# Patient Record
Sex: Female | Born: 1945 | Race: Black or African American | Hispanic: No | Marital: Single | Smoking: Former smoker
Health system: Southern US, Community
[De-identification: ages and names within clinical notes are randomized; demographics above are authoritative.]

## PROBLEM LIST (undated history)

## (undated) DIAGNOSIS — I10 Essential (primary) hypertension: Secondary | ICD-10-CM

## (undated) DIAGNOSIS — I471 Supraventricular tachycardia, unspecified: Secondary | ICD-10-CM

## (undated) DIAGNOSIS — F4310 Post-traumatic stress disorder, unspecified: Secondary | ICD-10-CM

## (undated) DIAGNOSIS — I499 Cardiac arrhythmia, unspecified: Secondary | ICD-10-CM

## (undated) DIAGNOSIS — K5720 Diverticulitis of large intestine with perforation and abscess without bleeding: Secondary | ICD-10-CM

## (undated) DIAGNOSIS — Z4889 Encounter for other specified surgical aftercare: Secondary | ICD-10-CM

## (undated) HISTORY — PX: KNEE SURGERY: SHX244

## (undated) HISTORY — PX: CHOLECYSTECTOMY: SHX55

---

## 2017-03-28 ENCOUNTER — Other Ambulatory Visit: Payer: Self-pay

## 2017-03-28 ENCOUNTER — Encounter (HOSPITAL_COMMUNITY): Payer: Self-pay | Admitting: *Deleted

## 2017-03-28 ENCOUNTER — Inpatient Hospital Stay (HOSPITAL_COMMUNITY)
Admission: AD | Admit: 2017-03-28 | Discharge: 2017-03-30 | DRG: 309 | Disposition: A | Discharge status: Home / Self Care | Payer: Medicare Other | Source: Other Acute Inpatient Hospital | Attending: Cardiology | Admitting: Cardiology

## 2017-03-28 DIAGNOSIS — R778 Other specified abnormalities of plasma proteins: Secondary | ICD-10-CM

## 2017-03-28 DIAGNOSIS — I471 Supraventricular tachycardia, unspecified: Secondary | ICD-10-CM | POA: Diagnosis present

## 2017-03-28 DIAGNOSIS — Z8249 Family history of ischemic heart disease and other diseases of the circulatory system: Secondary | ICD-10-CM | POA: Diagnosis not present

## 2017-03-28 DIAGNOSIS — K5901 Slow transit constipation: Secondary | ICD-10-CM

## 2017-03-28 DIAGNOSIS — F4310 Post-traumatic stress disorder, unspecified: Secondary | ICD-10-CM | POA: Diagnosis present

## 2017-03-28 DIAGNOSIS — I10 Essential (primary) hypertension: Secondary | ICD-10-CM

## 2017-03-28 DIAGNOSIS — I2510 Atherosclerotic heart disease of native coronary artery without angina pectoris: Secondary | ICD-10-CM | POA: Diagnosis present

## 2017-03-28 DIAGNOSIS — R079 Chest pain, unspecified: Secondary | ICD-10-CM

## 2017-03-28 DIAGNOSIS — I248 Other forms of acute ischemic heart disease: Secondary | ICD-10-CM | POA: Diagnosis present

## 2017-03-28 DIAGNOSIS — R7989 Other specified abnormal findings of blood chemistry: Secondary | ICD-10-CM

## 2017-03-28 DIAGNOSIS — Z87891 Personal history of nicotine dependence: Secondary | ICD-10-CM | POA: Diagnosis not present

## 2017-03-28 HISTORY — DX: Post-traumatic stress disorder, unspecified: F43.10

## 2017-03-28 HISTORY — DX: Essential (primary) hypertension: I10

## 2017-03-28 HISTORY — DX: Supraventricular tachycardia: I47.1

## 2017-03-28 HISTORY — DX: Cardiac arrhythmia, unspecified: I49.9

## 2017-03-28 HISTORY — DX: Supraventricular tachycardia, unspecified: I47.10

## 2017-03-28 LAB — LIPID PANEL
Cholesterol: 241 mg/dL — ABNORMAL HIGH (ref 0–200)
HDL: 59 mg/dL (ref >40)
LDL CALC: 170 mg/dL — AB (ref 0–99)
Total CHOL/HDL Ratio: 4.1 RATIO
Triglycerides: 58 mg/dL (ref <150)
VLDL: 12 mg/dL (ref 0–40)

## 2017-03-28 LAB — TROPONIN I
TROPONIN I: 0.32 ng/mL — AB (ref <0.03)
Troponin I: 0.22 ng/mL (ref <0.03)
Troponin I: 0.18 ng/mL (ref <0.03)

## 2017-03-28 LAB — HEPARIN LEVEL (UNFRACTIONATED): HEPARIN UNFRACTIONATED: 0.55 [IU]/mL (ref 0.30–0.70)

## 2017-03-28 LAB — BRAIN NATRIURETIC PEPTIDE: B NATRIURETIC PEPTIDE 5: 61.6 pg/mL (ref 0.0–100.0)

## 2017-03-28 LAB — TSH: TSH: 2.521 u[IU]/mL (ref 0.350–4.500)

## 2017-03-28 MED ORDER — HEPARIN BOLUS VIA INFUSION
4000.0000 [IU] | Freq: Once | INTRAVENOUS | Status: AC
  Administered 2017-03-28: 4000 [IU] via INTRAVENOUS
  Filled 2017-03-28: qty 4000

## 2017-03-28 MED ORDER — SODIUM CHLORIDE 0.9% FLUSH
3.0000 mL | Freq: Two times a day (BID) | INTRAVENOUS | Status: DC
  Administered 2017-03-29 – 2017-03-30 (×3): 3 mL via INTRAVENOUS

## 2017-03-28 MED ORDER — ASPIRIN EC 81 MG PO TBEC
81.0000 mg | DELAYED_RELEASE_TABLET | Freq: Every day | ORAL | Status: DC
  Administered 2017-03-28 – 2017-03-30 (×3): 81 mg via ORAL
  Filled 2017-03-28 (×3): qty 1

## 2017-03-28 MED ORDER — ATENOLOL 25 MG PO TABS
25.0000 mg | ORAL_TABLET | Freq: Every day | ORAL | Status: DC
  Administered 2017-03-28 – 2017-03-29 (×2): 25 mg via ORAL
  Filled 2017-03-28 (×2): qty 1

## 2017-03-28 MED ORDER — HYDROCHLOROTHIAZIDE 25 MG PO TABS
25.0000 mg | ORAL_TABLET | Freq: Every day | ORAL | Status: DC
  Administered 2017-03-28 – 2017-03-30 (×3): 25 mg via ORAL
  Filled 2017-03-28 (×3): qty 1

## 2017-03-28 MED ORDER — AMLODIPINE BESYLATE 10 MG PO TABS
10.0000 mg | ORAL_TABLET | Freq: Every day | ORAL | Status: DC
  Administered 2017-03-28 – 2017-03-30 (×3): 10 mg via ORAL
  Filled 2017-03-28 (×3): qty 1

## 2017-03-28 MED ORDER — ATORVASTATIN CALCIUM 80 MG PO TABS
80.0000 mg | ORAL_TABLET | Freq: Every day | ORAL | Status: DC
  Administered 2017-03-28 – 2017-03-29 (×2): 80 mg via ORAL
  Filled 2017-03-28 (×2): qty 1

## 2017-03-28 MED ORDER — ACETAMINOPHEN 325 MG PO TABS
650.0000 mg | ORAL_TABLET | Freq: Four times a day (QID) | ORAL | Status: DC | PRN
  Administered 2017-03-28 – 2017-03-30 (×6): 650 mg via ORAL
  Filled 2017-03-28 (×6): qty 2

## 2017-03-28 MED ORDER — HEPARIN (PORCINE) IN NACL 100-0.45 UNIT/ML-% IJ SOLN
900.0000 [IU]/h | INTRAMUSCULAR | Status: DC
  Administered 2017-03-28 (×2): 900 [IU]/h via INTRAVENOUS
  Filled 2017-03-28 (×2): qty 250

## 2017-03-28 NOTE — Progress Notes (Signed)
Admitted with SVT (now resolved) and elevated troponin; continue atenolol; cycle enzymes. Brandi Hendrix

## 2017-03-28 NOTE — Progress Notes (Signed)
ANTICOAGULATION CONSULT NOTE - Follow Up Consult  Pharmacy Consult for Heparin Indication: chest pain/ACS  No Known Allergies  Patient Measurements: Height:  (167.6 cm) Weight: 153 lb 9.6 oz (69.7 kg) IBW/kg (Calculated) : 59.3 Heparin Dosing Weight:    Vital Signs: Temp: 97.5 F (36.4 C) (04/07 0502) Temp Source: Oral (04/07 0502) BP: 130/78 (04/07 0502) Pulse Rate: 79 (04/07 0502)  Labs:  Recent Labs  03/28/17 0631 03/28/17 1258  HEPARINUNFRC  --  0.55  TROPONINI 0.32*  --     CrCl cannot be calculated (No order found.).   Assessment: CC/HPI: Presented to [facility] with CP/palpitations and SVT. Found to have trop 0.08. No heparin given at [facility]. To begin heparin gtt for r/o ACS. --Labs from [place]: SCr 1.15, Hgb 12, Hct 38.2, plt 318  PMH: HTN, PTSD, h/o rheumatic fever, palpitations, constipation  Anticoag: Heparin for r/o ACS. HL 0.55 in goal  Goal of Therapy:  Heparin level 0.3-0.7 units/ml Monitor platelets by anticoagulation protocol: Yes   Plan:  Continue Heparin gtt at 900 units/hr Daily heparin level and CBC   Tija Biss S. Merilynn Finland, PharmD, BCPS Clinical Staff Pharmacist Pager [phone]  Misty Stanley Stillinger 03/28/2017,1:58 PM

## 2017-03-28 NOTE — H&P (Signed)
CARDIOLOGY OBSERVATION HISTORY AND PHYSICAL EXAMINATION NOTE  Patient ID: Brandi Hendrix MRN: [id number], DOB/AGE: [birth year]   Admit date: 03/28/2017   Primary Physician: No PCP Per Patient Primary Cardiologist: new  Reason for admission: Chest pain and SVT  HPI: This is a 71 y.o. AA female with no prior history of CAD but history of HTN and PTSD presented to [facility] with chest discomfort and palpitations.  Patient said that today she started having sudden onset chest tightness which was substernal in location, without radiation, started at rest associated with palpitations. Patient had palpitations last week as well however these lasted longer than usual palpitations. She recalls she had rheumatic fever when she was 12. Patient denied chest pain, dyspnea, PND, orthopnea, leg swelling, increased abdominal girth, syncope, presyncope. She also denied  Easy bruisability, prior bleeding. She frequently gets constipated and eats meat every day.  Her initial troponin was 0.08 so she was transferred to [facility].   Problem List: Past Medical History:  Diagnosis Date  . Dysrhythmia   . Hypertension   . PTSD (post-traumatic stress disorder)     Past Surgical History:  Procedure Laterality Date  . CHOLECYSTECTOMY    . KNEE SURGERY       Allergies: Not on File   Home Medications Current Facility-Administered Medications  Medication Dose Route Frequency Provider Last Rate Last Dose  . acetaminophen (TYLENOL) tablet 650 mg  650 mg Oral Q6H PRN Joellyn Rued, MD   650 mg at 03/28/17 0636  . amLODipine (NORVASC) tablet 10 mg  10 mg Oral Daily Joellyn Rued, MD      . aspirin EC tablet 81 mg  81 mg Oral Daily Joellyn Rued, MD      . atenolol (TENORMIN) tablet 25 mg  25 mg Oral Daily Joellyn Rued, MD      . atorvastatin (LIPITOR) tablet 80 mg  80 mg Oral q1800 Joellyn Rued, MD      . heparin ADULT infusion 100 units/mL (25000 units/283mL sodium chloride 0.45%)   900 Units/hr Intravenous Continuous Titus Mould, RPH 9 mL/hr at 03/28/17 1308 900 Units/hr at 03/28/17 6578  . hydrochlorothiazide (HYDRODIURIL) tablet 25 mg  25 mg Oral Daily Joellyn Rued, MD      . sodium chloride flush (NS) 0.9 % injection 3 mL  3 mL Intravenous Q12H Joellyn Rued, MD         Family History  Problem Relation Age of Onset  . Heart disease Mother   . Parkinson's disease Father      Social History   Social History  . Marital status: Single    Spouse name: N/A  . Number of children: N/A  . Years of education: N/A   Occupational History  . Not on file.   Social History Main Topics  . Smoking status: Former Smoker    Types: Cigarettes    Quit date: 73  . Smokeless tobacco: Not on file  . Alcohol use No     Comment: former user  . Drug use: Unknown     Comment: former  . Sexual activity: Not on file   Other Topics Concern  . Not on file   Social History Narrative  . No narrative on file     Review of Systems: General: negative for chills, fever, night sweats or weight changes.  Cardiovascular: chest pain negative for dyspnea on exertion, edema, orthopnea, palpitations, paroxysmal nocturnal dyspnea or shortness of breath  Dermatological:  negative for rash Respiratory: negative for cough or wheezing Urologic: negative for hematuria Abdominal: constipation negative for nausea, vomiting, diarrhea, bright red blood per rectum, melena, or hematemesis Neurologic: negative for visual changes, syncope, or dizziness Endocrine: no diabetes, no hypothyroidism Immunological: no lymph adenopathy Psych: non homicidal/suicidal  Physical Exam: Vitals: BP 130/78 (BP Location: Right Arm)   Pulse 79   Temp 97.5 F (36.4 C) (Oral)   Ht  (1.676 m)   Wt 69.7 kg (153 lb 9.6 oz)   SpO2 100%   BMI 24.79 kg/m  General: not in acute distress Neck: JVP flat, neck supple Heart: regular rate and rhythm, S1, S2, no murmurs  Lungs: CTAB  GI: non tender,  non distended, bowel sounds present Extremities: no edema Neuro: AAO x 3  Psych: normal affect, no anxiety   Labs:  No results found for this or any previous visit (from the past 24 hour(s)).   Radiology/Studies: No results found.  EKG: SVT on outside strips,  03/28/17 Normal sinus rhythm, no ST T wave changes, no blocks  Echo: pending  Cardiac cath: not performed  Medical decision making:  Discussed care with the patient and husband Discussed care with the outside ED physician on the phone Reviewed labs and imaging personally Reviewed prior records  ASSESSMENT AND PLAN:  This is a 71 y.o. AA female risk factors and no prior CAD presented with chest pain and SVT    Active Problems:   SVT (supraventricular tachycardia) (HCC)   AVNRT, symptomatic - cycle troponin, keep potassium >4, calcium >9, magnesium >2, echocardiogram in the AM - started atenolol, EP consult  Chest pain in the setting of SVT with elevated troponin -cycle troponin, aspirin and Lipitor - will treat with IV heparin for now but after next troponin if flat can hold. Most likely EP will require ischemic evaluation prior to proceeding treatment of SVT  Hypertension, essential Continue HCTZ and amlodipine  PTSD on abilify at home, not taking for 1 week  Constipation 2/2 low fiber diet - increase fiber, golytley  Signed, Joellyn Rued, MD MS 03/28/2017, 7:29 AM

## 2017-03-28 NOTE — Plan of Care (Signed)
Problem: Safety: Goal: Ability to remain free from injury will improve Outcome: Progressing Encouraged to use call bell. Spouse by the bedside.

## 2017-03-28 NOTE — Progress Notes (Signed)
  Patient admitted from [facility] for SVT. Vitals stable. Oriented to roo and surroundings. Telemetry applied. CCM  notified. Skin assessment done times two nurse.

## 2017-03-28 NOTE — Progress Notes (Signed)
ANTICOAGULATION CONSULT NOTE - Initial Consult  Pharmacy Consult for Heparin Indication: chest pain/ACS  Not on File  Patient Measurements: Height:  (167.6 cm) Weight: 153 lb 9.6 oz (69.7 kg) IBW/kg (Calculated) : 59.3 Heparin Dosing Weight: 69.7 kg  Vital Signs: Temp: 97.5 F (36.4 C) (04/07 0502) Temp Source: Oral (04/07 0502) BP: 130/78 (04/07 0502) Pulse Rate: 79 (04/07 0502)  Labs: No results for input(s): HGB, HCT, PLT, APTT, LABPROT, INR, HEPARINUNFRC, HEPRLOWMOCWT, CREATININE, CKTOTAL, CKMB, TROPONINI in the last 72 hours.  CrCl cannot be calculated (No order found.).   Medical History: No past medical history on file.  Medications:  Awaiting electronic med rec  Assessment: 71 y.o. F presented to [facility] with CP. Found to have trop 0.08. No heparin given at [facility]. To begin heparin gtt for r/o ACS.  Labs from [place]: SCr 1.15, Hgb 12, Hct 38.2, plt 318  Goal of Therapy:  Heparin level 0.3-0.7 units/ml Monitor platelets by anticoagulation protocol: Yes   Plan:  Heparin IV bolus 4000 units Heparin gtt at 900 units/hr Will f/u heparin level in 6 hours Daily heparin level and CBC   Sahana Boyland, Hilario Quarry 03/28/2017,5:59 AM

## 2017-03-29 ENCOUNTER — Observation Stay (HOSPITAL_BASED_OUTPATIENT_CLINIC_OR_DEPARTMENT_OTHER): Payer: Medicare Other

## 2017-03-29 DIAGNOSIS — K5901 Slow transit constipation: Secondary | ICD-10-CM | POA: Diagnosis present

## 2017-03-29 DIAGNOSIS — I248 Other forms of acute ischemic heart disease: Secondary | ICD-10-CM | POA: Diagnosis present

## 2017-03-29 DIAGNOSIS — I10 Essential (primary) hypertension: Secondary | ICD-10-CM | POA: Diagnosis not present

## 2017-03-29 DIAGNOSIS — R079 Chest pain, unspecified: Secondary | ICD-10-CM | POA: Diagnosis present

## 2017-03-29 DIAGNOSIS — I2510 Atherosclerotic heart disease of native coronary artery without angina pectoris: Secondary | ICD-10-CM | POA: Diagnosis present

## 2017-03-29 DIAGNOSIS — R072 Precordial pain: Secondary | ICD-10-CM

## 2017-03-29 DIAGNOSIS — I471 Supraventricular tachycardia: Secondary | ICD-10-CM | POA: Diagnosis present

## 2017-03-29 DIAGNOSIS — Z87891 Personal history of nicotine dependence: Secondary | ICD-10-CM | POA: Diagnosis not present

## 2017-03-29 DIAGNOSIS — Z8249 Family history of ischemic heart disease and other diseases of the circulatory system: Secondary | ICD-10-CM | POA: Diagnosis not present

## 2017-03-29 DIAGNOSIS — F4310 Post-traumatic stress disorder, unspecified: Secondary | ICD-10-CM | POA: Diagnosis present

## 2017-03-29 DIAGNOSIS — R748 Abnormal levels of other serum enzymes: Secondary | ICD-10-CM

## 2017-03-29 LAB — COMPREHENSIVE METABOLIC PANEL
ALBUMIN: 3.3 g/dL — AB (ref 3.5–5.0)
ALT: 18 U/L (ref 14–54)
AST: 23 U/L (ref 15–41)
Alkaline Phosphatase: 42 U/L (ref 38–126)
Anion gap: 9 (ref 5–15)
BUN: 15 mg/dL (ref 6–20)
CHLORIDE: 104 mmol/L (ref 101–111)
CO2: 25 mmol/L (ref 22–32)
Calcium: 9.5 mg/dL (ref 8.9–10.3)
Creatinine, Ser: 0.94 mg/dL (ref 0.44–1.00)
GFR calc Af Amer: >60 mL/min (ref >60)
Glucose, Bld: 89 mg/dL (ref 65–99)
POTASSIUM: 3.5 mmol/L (ref 3.5–5.1)
SODIUM: 138 mmol/L (ref 135–145)
Total Bilirubin: 0.4 mg/dL (ref 0.3–1.2)
Total Protein: 7.6 g/dL (ref 6.5–8.1)

## 2017-03-29 LAB — CBC
HCT: 37.4 % (ref 36.0–46.0)
HEMOGLOBIN: 12 g/dL (ref 12.0–15.0)
MCH: 28.6 pg (ref 26.0–34.0)
MCHC: 32.1 g/dL (ref 30.0–36.0)
MCV: 89.3 fL (ref 78.0–100.0)
Platelets: 339 10*3/uL (ref 150–400)
RBC: 4.19 MIL/uL (ref 3.87–5.11)
RDW: 14.4 % (ref 11.5–15.5)
WBC: 7.2 10*3/uL (ref 4.0–10.5)

## 2017-03-29 LAB — ECHOCARDIOGRAM COMPLETE
HEIGHTINCHES: 66 in
WEIGHTICAEL: 2502.4 [oz_av]

## 2017-03-29 LAB — HEMOGLOBIN A1C
HEMOGLOBIN A1C: 5.9 % — AB (ref 4.8–5.6)
MEAN PLASMA GLUCOSE: 123 mg/dL

## 2017-03-29 LAB — PROTIME-INR
INR: 1.03
PROTHROMBIN TIME: 13.5 s (ref 11.4–15.2)

## 2017-03-29 LAB — HEPARIN LEVEL (UNFRACTIONATED): Heparin Unfractionated: 0.57 IU/mL (ref 0.30–0.70)

## 2017-03-29 LAB — GLUCOSE, CAPILLARY: GLUCOSE-CAPILLARY: 70 mg/dL (ref 65–99)

## 2017-03-29 MED ORDER — HEPARIN SODIUM (PORCINE) 5000 UNIT/ML IJ SOLN
5000.0000 [IU] | Freq: Three times a day (TID) | INTRAMUSCULAR | Status: DC
  Administered 2017-03-29 – 2017-03-30 (×4): 5000 [IU] via SUBCUTANEOUS
  Filled 2017-03-29 (×4): qty 1

## 2017-03-29 MED ORDER — ATENOLOL 25 MG PO TABS
50.0000 mg | ORAL_TABLET | Freq: Every day | ORAL | Status: DC
  Administered 2017-03-30: 50 mg via ORAL
  Filled 2017-03-29: qty 2

## 2017-03-29 NOTE — Progress Notes (Signed)
  Echocardiogram 2D Echocardiogram has been performed.  Tye Savoy 03/29/2017, 3:23 PM

## 2017-03-29 NOTE — Progress Notes (Signed)
Progress Note  Patient Name: Brandi Hendrix Date of Encounter: 03/29/2017  Primary Cardiologist: New  Subjective   Mild residual chest soreness; no dyspnea  Inpatient Medications    Scheduled Meds: . amLODipine  10 mg Oral Daily  . aspirin EC  81 mg Oral Daily  . atenolol  25 mg Oral Daily  . atorvastatin  80 mg Oral q1800  . hydrochlorothiazide  25 mg Oral Daily  . sodium chloride flush  3 mL Intravenous Q12H   Continuous Infusions: . heparin 900 Units/hr (03/28/17 2300)   PRN Meds: acetaminophen   Vital Signs    Vitals:   03/28/17 2100 03/29/17 0421 03/29/17 0455 03/29/17 0900  BP: 104/68  123/83 124/71  Pulse: 64  73 89  Resp: Temp: 97.9 F (36.6 C)  97.4 F (36.3 C) 98 F (36.7 C)  TempSrc: Oral  Oral Oral  SpO2: 98%  100% 100%  Weight:  156 lb 6.4 oz (70.9 kg)    Height:        Intake/Output Summary (Last 24 hours) at 03/29/17 1005 Last data filed at 03/29/17 0800  Gross per 24 hour  Intake              312 ml  Output                0 ml  Net              312 ml   Filed Weights   03/28/17 0502 03/28/17 0900 03/29/17 0421  Weight: 153 lb 9.6 oz (69.7 kg) 153 lb 9.6 oz (69.7 kg) 156 lb 6.4 oz (70.9 kg)    Telemetry    Sinus with PVC- Personally Reviewed  Physical Exam   GEN: No acute distress.   Neck: No JVD Cardiac: RRR, no murmurs, rubs, or gallops.  Respiratory: Clear to auscultation bilaterally. GI: Soft, nontender, non-distended  MS: No edema; No deformity. Neuro:  Nonfocal  Psych: Normal affect   Labs    Chemistry Recent Labs Lab 03/29/17 0236  NA 138  K 3.5  CL 104  CO2 25  GLUCOSE 89  BUN 15  CREATININE 0.94  CALCIUM 9.5  PROT 7.6  ALBUMIN 3.3*  AST 23  ALT 18  ALKPHOS 42  BILITOT 0.4  GFRNONAA >60  GFRAA >60  ANIONGAP 9     Hematology Recent Labs Lab 03/29/17 0236  WBC 7.2  RBC 4.19  HGB 12.0  HCT 37.4  MCV 89.3  MCH 28.6  MCHC 32.1  RDW 14.4  PLT 339    Cardiac Enzymes Recent  Labs Lab 03/28/17 0631 03/28/17 1258 03/28/17 1805  TROPONINI 0.32* 0.22* 0.18*     BNP Recent Labs Lab 03/28/17 0631  BNP 61.6     Patient Profile     71 y.o. female transferred from [facility] following episode of supraventricular tachycardia terminated with adenosine; associated chest tightness.  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 Atenolol initiated. Increase to 50 mg daily. If she has recurrent episodes despite beta-blockade she would need to be evaluated by electrophysiology for consideration of ablation.  2 elevated troponin-likely demand ischemia in the setting of SVT. She did not have exertional chest pain prior to admission. She has minimal residual soreness this morning. We will arrange a stress nuclear study tomorrow morning to screen for coronary disease. If negative would favor medical therapy. Schedule echocardiogram to assess LV function. Continue aspirin and statin. Discontinue  IV heparin.  3 hypertension-blood pressure controlled. Continue present medications.  Signed, Olga Millers, MD  03/29/2017, 10:05 AM

## 2017-03-30 ENCOUNTER — Inpatient Hospital Stay (HOSPITAL_COMMUNITY): Payer: Medicare Other

## 2017-03-30 ENCOUNTER — Encounter (HOSPITAL_COMMUNITY): Payer: Self-pay | Admitting: Student

## 2017-03-30 DIAGNOSIS — R748 Abnormal levels of other serum enzymes: Secondary | ICD-10-CM

## 2017-03-30 LAB — NM MYOCAR MULTI W/SPECT W/WALL MOTION / EF
CSEPED: 0 min
CSEPEDS: 0 s
CSEPEW: 1 METS
CSEPHR: 63 %
CSEPPHR: 96 {beats}/min
MPHR: 151 {beats}/min
Rest HR: 76 {beats}/min

## 2017-03-30 LAB — GLUCOSE, CAPILLARY
Glucose-Capillary: 99 mg/dL (ref 65–99)
Glucose-Capillary: 89 mg/dL (ref 65–99)

## 2017-03-30 MED ORDER — ATENOLOL 50 MG PO TABS: 50.0000 mg | ORAL_TABLET | Freq: Every day | ORAL | 11 refills | Status: AC

## 2017-03-30 MED ORDER — PROPRANOLOL HCL 10 MG PO TABS
10.0000 mg | ORAL_TABLET | Freq: Four times a day (QID) | ORAL | Status: DC | PRN
  Filled 2017-03-30: qty 1

## 2017-03-30 MED ORDER — ASPIRIN 81 MG PO TBEC: 81.0000 mg | DELAYED_RELEASE_TABLET | Freq: Every day | ORAL | Status: AC

## 2017-03-30 MED ORDER — REGADENOSON 0.4 MG/5ML IV SOLN
0.4000 mg | Freq: Once | INTRAVENOUS | Status: AC
  Administered 2017-03-30: 0.4 mg via INTRAVENOUS
  Filled 2017-03-30: qty 5

## 2017-03-30 MED ORDER — ATORVASTATIN CALCIUM 80 MG PO TABS: 80.0000 mg | ORAL_TABLET | Freq: Every day | ORAL | 11 refills | Status: AC

## 2017-03-30 MED ORDER — TECHNETIUM TC 99M TETROFOSMIN IV KIT
30.0000 | PACK | Freq: Once | INTRAVENOUS | Status: AC | PRN
  Administered 2017-03-30: 30 via INTRAVENOUS

## 2017-03-30 MED ORDER — TECHNETIUM TC 99M TETROFOSMIN IV KIT
10.0000 | PACK | Freq: Once | INTRAVENOUS | Status: AC | PRN
  Administered 2017-03-30: 10 via INTRAVENOUS

## 2017-03-30 MED ORDER — PROPRANOLOL HCL 10 MG PO TABS: 10.0000 mg | ORAL_TABLET | Freq: Four times a day (QID) | ORAL | 3 refills | Status: AC | PRN

## 2017-03-30 MED ORDER — REGADENOSON 0.4 MG/5ML IV SOLN
INTRAVENOUS | Status: AC
  Administered 2017-03-30: 0.4 mg via INTRAVENOUS
  Filled 2017-03-30: qty 5

## 2017-03-30 NOTE — Discharge Summary (Signed)
Discharge Summary    Patient ID: Brandi Hendrix,  MRN: [id number], DOB/AGE: 1946/01/11 71 y.o.  Admit date: 03/28/2017 Discharge date: 03/30/2017  Primary Care Provider: No PCP Per Patient Primary Cardiologist: Seen by Dr. Jens Som --> Followed by the [facility]  Discharge Diagnoses    Active Problems:   SVT (supraventricular tachycardia) (HCC)   Chest pain with high risk of acute coronary syndrome   Slow transit constipation   Essential hypertension   History of Present Illness     Brandi Hendrix is a 71 y.o. female with past medical history of HTN and PTSD who presented to [facility] on 03/28/2017 as a transfer from [facility] for chest pain and pSVT.   Reported having chest discomfort and palpitations starting earlier in the day which occurred while at rest. She was noted to have an episode of SVT while at [facility] with initial troponin being 0.08. She was started on Atenolol and admitted for further observation.   Hospital Course     Consultants: None  The following day, she reported a mild chest soreness which was significantly improved when compared to her presenting symptoms. Her episode of SVT was thought to be due to AVNRT. Atenolol was increased from  daily to  daily. Cyclic troponin values were at 0.32, 0.22, and 0.18. Echocardiogram showed a preserved EF of 60-65% with Grade 1 DD and no regional WMA. A nuclear stress test was ordered for 03/30/2017 to evaluate for ischemia.   Her stress test was performed and showed normal LV function with no evidence of reversible ischemia or prior infarction. She was last examined by Dr. Elease Hashimoto and deemed stable for discharge. She will continue on Atenolol  daily along with being provided an Rx for Propranolol  QID PRN for palpitations. [facility] but if unable to do so, our contact information was provided in her AVS. She was discharged home in good condition.    _____________  Discharge  Vitals Blood pressure 108/62, pulse 63, temperature 98.4 F (36.9 C), temperature source Oral, resp. rate 20, height  (1.676 m), weight 153 lb 6.4 oz (69.6 kg), SpO2 95 %.  Filed Weights   03/28/17 0900 03/29/17 0421 03/30/17 0431  Weight: 153 lb 9.6 oz (69.7 kg) 156 lb 6.4 oz (70.9 kg) 153 lb 6.4 oz (69.6 kg)    Labs & Radiologic Studies     CBC  Recent Labs  03/29/17 0236  WBC 7.2  HGB 12.0  HCT 37.4  MCV 89.3  PLT 339   Basic Metabolic Panel  Recent Labs  03/29/17 0236  NA 138  K 3.5  CL 104  CO2 25  GLUCOSE 89  BUN 15  CREATININE 0.94  CALCIUM 9.5   Liver Function Tests  Recent Labs  03/29/17 0236  AST 23  ALT 18  ALKPHOS 42  BILITOT 0.4  PROT 7.6  ALBUMIN 3.3*   No results for input(s): LIPASE, AMYLASE in the last 72 hours. Cardiac Enzymes  Recent Labs  03/28/17 0631 03/28/17 1258 03/28/17 1805  TROPONINI 0.32* 0.22* 0.18*   BNP Invalid input(s): POCBNP D-Dimer No results for input(s): DDIMER in the last 72 hours. Hemoglobin A1C  Recent Labs  03/28/17 0631  HGBA1C 5.9*   Fasting Lipid Panel  Recent Labs  03/28/17 0631  CHOL 241*  HDL 59  LDLCALC 170*  TRIG 58  CHOLHDL 4.1   Thyroid Function Tests  Recent Labs  03/28/17 0631  TSH  2.521    Nm Myocar Multi W/spect W/wall Motion / Ef  Result Date: 03/30/2017 CLINICAL DATA:  Chest pain. Palpitations. Abnormal EKG. Personal history of smoking, hypertension, coronary artery disease, and PTSD. EXAM: MYOCARDIAL IMAGING WITH SPECT (REST AND PHARMACOLOGIC-STRESS) GATED LEFT VENTRICULAR WALL MOTION STUDY LEFT VENTRICULAR EJECTION FRACTION TECHNIQUE: Standard myocardial SPECT imaging was performed after resting intravenous injection of 10 mCi Tc-62m tetrofosmin. Subsequently, intravenous infusion of Lexiscan was performed under the supervision of the Cardiology staff. At peak effect of the drug, 30 mCi Tc-67m tetrofosmin was injected intravenously and standard myocardial SPECT  imaging was performed. Quantitative gated imaging was also performed to evaluate left ventricular wall motion, and estimate left ventricular ejection fraction. COMPARISON:  CT of the chest 03/27/2017.  Chest x-ray 03/27/2017. FINDINGS: Perfusion: No decreased activity in the left ventricle on stress imaging to suggest reversible ischemia or infarction. Apical thinning on the rest imaging is likely artifactual. Wall Motion: Normal left ventricular wall motion. No left ventricular dilation. Left Ventricular Ejection Fraction: 76 % End diastolic volume 54 ml End systolic volume 13 ml IMPRESSION: 1. No reversible ischemia or infarction. 2. Normal left ventricular wall motion. 3. Left ventricular ejection fraction 76% 4. Non invasive risk stratification*: Low *2012 Appropriate Use Criteria for Coronary Revascularization Focused Update: J Am Coll Cardiol. 2012;59(9):857-881. http://content.dementiazones.com.aspx?articleid=1201161 Electronically Signed   By: Marin Roberts M.D.   On: 03/30/2017 14:13    Diagnostic Studies/Procedures    Echocardiogram: 03/29/2017 Study Conclusions  - Left ventricle: The cavity size was normal. There was mild   concentric hypertrophy. Systolic function was normal. The   estimated ejection fraction was in the range of 60% to 65%. Wall   motion was normal; there were no regional wall motion   abnormalities. Doppler parameters are consistent with abnormal   left ventricular relaxation (grade 1 diastolic dysfunction).   There was no evidence of elevated ventricular filling pressure by   Doppler parameters. - Aortic valve: Trileaflet; normal thickness leaflets. There was no   regurgitation. - Aortic root: The aortic root was normal in size. - Mitral valve: Structurally normal valve. There was no   regurgitation. - Left atrium: The atrium was normal in size. - Right ventricle: The cavity size was normal. Wall thickness was   normal. Systolic function was normal. -  Right atrium: The atrium was normal in size. - Tricuspid valve: There was trivial regurgitation. - Pulmonary arteries: Systolic pressure was within the normal   range. - Inferior vena cava: The vessel was normal in size. - Pericardium, extracardiac: There was no pericardial effusion.  Disposition   Pt is being discharged home today in good condition.  Follow-up Plans & Appointments    Follow-up Information    [facility] within 2 weeks. Follow up.        Olga Millers, MD Follow up.   Specialty:  Cardiology Why:  If unable to be followed by Cardiology with the [facility], please contact our office.  Contact information: [address] [place] [address] [phone]            Discharge Medications     Medication List    TAKE these medications   amLODipine 10 MG tablet Commonly known as:  NORVASC Take 10 mg by mouth daily.   aspirin 81 MG EC tablet Take 1 tablet (81 mg total) by mouth daily. Start taking on:  03/31/2017   atenolol 50 MG tablet Commonly known as:  TENORMIN Take 1 tablet (50 mg  total) by mouth daily. Start taking on:  03/31/2017   atorvastatin 80 MG tablet Commonly known as:  LIPITOR Take 1 tablet (80 mg total) by mouth daily at 6 PM.   diclofenac 75 MG EC tablet Commonly known as:  VOLTAREN Take 75 mg by mouth 2 (two) times daily as needed for moderate pain.   GINKGO BILOBA PO Take 1 tablet by mouth daily.   hydrochlorothiazide 25 MG tablet Commonly known as:  HYDRODIURIL Take 25 mg by mouth daily.   latanoprost 0.005 % ophthalmic solution Commonly known as:  XALATAN Place 1 drop into both eyes daily.   MULTIVITAMIN PO Take 1 tablet by mouth daily.   propranolol 10 MG tablet Commonly known as:  INDERAL Take 1 tablet (10 mg total) by mouth 4 (four) times daily as needed (fast heart rate).   VITAMIN C PO Take 1 tablet by mouth daily.        Allergies No Known Allergies   Outstanding Labs/Studies    None  Duration of Discharge Encounter   Greater than 30 minutes including physician time.  Signed, Ellsworth Lennox, PA-C 03/30/2017, 4:21 PM   Attending Note:   The patient was seen and examined.  Agree with assessment and plan as noted above.  Changes made to the above note as needed.  Patient seen and independently examined with Randall An, PA .   We discussed all aspects of the encounter. I agree with the assessment and plan as stated above. See progress note from same day   1.   Chest pain :  Atypical, low risk myoview OK for Dc  2. SVT.    On Atenolol.   We have doubled her dose.  She may need to consider RF ablation if she continues to have episodes while on medical therapy. She may follow up with Dr. Jens Som or at the [facility] I have added Propranolol 10 mg QID RRN to her scheduled Atenolol to treat any break through SVt  I have also taught her the valsalva maneuver and the diving reflex.    I have spent a total of 40 minutes with patient reviewing hospital  notes , telemetry, EKGs, labs and examining patient as well as establishing an assessment and plan that was discussed with the patient. > 50% of time was spent in direct patient care.    Vesta Mixer, Montez Hageman., MD, [facility] 03/30/2017, 6:35 PM [address]  [address] Office [phone] Pager [phone]

## 2017-03-30 NOTE — Care Management Note (Signed)
Case Management Note Donn Pierini RN, BSN Unit 2W-Case Manager [phone]  Patient Details  Name: Brandi Hendrix MRN: [id number] Date of Birth: 1946/07/13  Subjective/Objective:  Pt tx from [facility] on 4/7 with SVT and chest pain                 Action/Plan: PTA pt lived at home- with spouse- bedside RN asked CM to see pt regarding VA needs- per pt she goes to the [facility]- call made to Ms. Para March- CSW at the [facility] clinic- on conversation with Ms. Para March there are no request at this time for any chart notes - she will make a note to the PCP-[facility]- that if any needed may request them to be sent from [facility]- pt has a few new scripts that were reviewed with Ms. Para March no needs for any special pre-auth on any- pt can take them to the [facility] clinic pharmacy to have them filled. Spoke with pt and spouse at bedside - shared above info with them that [place] has not requested any info from this stay as of this point in time- pt also needs to go by admitting to make sure [facility] is added as a payor on her way out. Pt has filled out request for records to be mailed to her address from medical records. No further CM needs noted.   Expected Discharge Date:  03/30/17               Expected Discharge Plan:  Home/Self Care  In-House Referral:     Discharge planning Services  CM Consult  Post Acute Care Choice:  NA Choice offered to:  NA  DME Arranged:    DME Agency:     HH Arranged:    HH Agency:     Status of Service:  Completed, signed off  If discussed at [employer] of Stay Meetings, dates discussed:    Additional Comments:  Darrold Span, RN 03/30/2017, 3:54 PM

## 2017-03-30 NOTE — Progress Notes (Signed)
Progress Note  Patient Name: Brandi Hendrix Date of Encounter: 03/30/2017  Primary Cardiologist: Dr. Jens Som  Subjective   Mild "fluttering" sensation along her chest overnight. No associated dyspnea. Seen in Nuclear Medicine for 1-day NST.   Inpatient Medications    Scheduled Meds: . amLODipine  10 mg Oral Daily  . aspirin EC  81 mg Oral Daily  . atenolol  50 mg Oral Daily  . atorvastatin  80 mg Oral q1800  . heparin subcutaneous  5,000 Units Subcutaneous Q8H  . hydrochlorothiazide  25 mg Oral Daily  . sodium chloride flush  3 mL Intravenous Q12H   Continuous Infusions:  PRN Meds: acetaminophen   Vital Signs    Vitals:   03/30/17 0431 03/30/17 0900 03/30/17 0933 03/30/17 0935  BP: 110/65 139/78 (!) 156/90 (!) 158/85  Pulse: 68     Resp: 18     Temp: 98.2 F (36.8 C)     TempSrc: Oral     SpO2: 97%     Weight: 153 lb 6.4 oz (69.6 kg)     Height:        Intake/Output Summary (Last 24 hours) at 03/30/17 0939 Last data filed at 03/29/17 1700  Gross per 24 hour  Intake              480 ml  Output                0 ml  Net              480 ml   Filed Weights   03/28/17 0900 03/29/17 0421 03/30/17 0431  Weight: 153 lb 9.6 oz (69.7 kg) 156 lb 6.4 oz (70.9 kg) 153 lb 6.4 oz (69.6 kg)    Telemetry    Not reviewed. Seen in Nuclear Medicine.   ECG    NSR, HR 76. Mild TWI in lead III - Personally Reviewed  Physical Exam   General: Well developed, well nourished African American female appearing in no acute distress. Head: Normocephalic, atraumatic.  Neck: Supple without bruits, JVD not elevated. Lungs:  Resp regular and unlabored, CTA. Heart: RRR, S1, S2, no S3, S4, or murmur; no rub. Abdomen: Soft, non-tender, non-distended with normoactive bowel sounds. No hepatomegaly. No rebound/guarding. No obvious abdominal masses. Extremities: No clubbing, cyanosis, or lower extremity edema. Distal pedal pulses are 2+ bilaterally. Neuro: Alert and oriented X 3.  Moves all extremities spontaneously. Psych: Normal affect.  Labs    Chemistry Recent Labs Lab 03/29/17 0236  NA 138  K 3.5  CL 104  CO2 25  GLUCOSE 89  BUN 15  CREATININE 0.94  CALCIUM 9.5  PROT 7.6  ALBUMIN 3.3*  AST 23  ALT 18  ALKPHOS 42  BILITOT 0.4  GFRNONAA >60  GFRAA >60  ANIONGAP 9     Hematology Recent Labs Lab 03/29/17 0236  WBC 7.2  RBC 4.19  HGB 12.0  HCT 37.4  MCV 89.3  MCH 28.6  MCHC 32.1  RDW 14.4  PLT 339    Cardiac Enzymes Recent Labs Lab 03/28/17 0631 03/28/17 1258 03/28/17 1805  TROPONINI 0.32* 0.22* 0.18*   No results for input(s): TROPIPOC in the last 168 hours.   BNP Recent Labs Lab 03/28/17 0631  BNP 61.6     DDimer No results for input(s): DDIMER in the last 168 hours.   Radiology    No results found.  Cardiac Studies   Echocardiogram: 03/29/2017 Study Conclusions  - Left ventricle: The cavity size  was normal. There was mild   concentric hypertrophy. Systolic function was normal. The   estimated ejection fraction was in the range of 60% to 65%. Wall   motion was normal; there were no regional wall motion   abnormalities. Doppler parameters are consistent with abnormal   left ventricular relaxation (grade 1 diastolic dysfunction).   There was no evidence of elevated ventricular filling pressure by   Doppler parameters. - Aortic valve: Trileaflet; normal thickness leaflets. There was no   regurgitation. - Aortic root: The aortic root was normal in size. - Mitral valve: Structurally normal valve. There was no   regurgitation. - Left atrium: The atrium was normal in size. - Right ventricle: The cavity size was normal. Wall thickness was   normal. Systolic function was normal. - Right atrium: The atrium was normal in size. - Tricuspid valve: There was trivial regurgitation. - Pulmonary arteries: Systolic pressure was within the normal   range. - Inferior vena cava: The vessel was normal in size. -  Pericardium, extracardiac: There was no pericardial effusion.  Patient Profile     71 y.o. female w/ PMH of HTN and PTSD who presented to [facility] on 4/7 as a transfer from [place] due to SVT (terminated with Adenosine) and associated chest pain.  Assessment & Plan    1. 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 Atenolol initiated and has been increased to 50 mg daily. No recurrence since admission.  - electrolytes and TSH WNL.  - If she has recurrent episodes despite beta-blockade she would need to be evaluated by electrophysiology for consideration of ablation.  2. Elevated troponin - cyclic values at 0.32, 0.22 and 0.18. - likely secondary to demand ischemia in the setting of SVT. She did not have exertional chest pain prior to admission. Echo shows a preserved EF of 60-65% with no WMA.  - seen in nuclear medicine for 1-day NST. Results pending following stress images.   3. HTN  - well-controlled.  - continue Amlodipine  daily, Atenolol  daily, and HCTZ  daily.    Lorri Frederick , PA-C 9:39 AM 03/30/2017 Pager: [phone]   Attending Note:   The patient was seen and examined.  Agree with assessment and plan as noted above.  Changes made to the above note as needed.  Patient seen and independently examined with Norwood Levo, PA .   We discussed all aspects of the encounter. I agree with the assessment and plan as stated above.  1. SVT , atenolol has been increased  I would also give her Propranolol to take as needed for break through SVT  She is followed at the [place].   Her husband will see if she can see [place] or will she need to be seen at the [facility]   2. Elevated troponin:  myoview is negative. She is ok to be discharged.    I have spent a total of 40 minutes with patient reviewing hospital  notes , telemetry, EKGs, labs and examining patient as well as establishing an assessment and plan that was discussed with the patient. > 50% of time was spent in  direct patient care.    Vesta Mixer, Montez Hageman., MD, [facility] 03/30/2017, 3:04 PM [address]  [address] Office [phone] Pager [phone]

## 2017-03-30 NOTE — Progress Notes (Signed)
Order received to discharge patient.  Telemetry monitor removed and CCMD notified.  PIV access removed.  Discharge instructions, follow up, medications and instructions for their use were discussed with patient. 

## 2020-03-02 ENCOUNTER — Other Ambulatory Visit: Payer: Self-pay

## 2020-03-02 DIAGNOSIS — R072 Precordial pain: Secondary | ICD-10-CM

## 2020-03-02 NOTE — Progress Notes (Signed)
Request to order Cardiac CT received by Brandi Hendrix by the [facility]. Order placed.

## 2020-03-23 ENCOUNTER — Telehealth (HOSPITAL_COMMUNITY): Payer: Self-pay | Admitting: Emergency Medicine

## 2020-03-23 NOTE — Telephone Encounter (Signed)
Attempted to call patient regarding upcoming cardiac CT appointment. °Left message on voicemail with name and callback number °Laconya Clere RN Navigator Cardiac Imaging °[facility] °[phone] Office °[phone] Cell ° °

## 2020-03-26 ENCOUNTER — Ambulatory Visit (HOSPITAL_COMMUNITY)
Admission: RE | Admit: 2020-03-26 | Discharge: 2020-03-26 | Disposition: A | Discharge status: Home / Self Care | Payer: No Typology Code available for payment source | Source: Ambulatory Visit | Attending: Cardiology | Admitting: Cardiology

## 2020-03-26 ENCOUNTER — Other Ambulatory Visit: Payer: Self-pay

## 2020-03-26 DIAGNOSIS — R072 Precordial pain: Secondary | ICD-10-CM

## 2020-03-26 LAB — POCT I-STAT CREATININE: Creatinine, Ser: 1.2 mg/dL — ABNORMAL HIGH (ref 0.44–1.00)

## 2020-03-26 MED ORDER — NITROGLYCERIN 0.4 MG SL SUBL
SUBLINGUAL_TABLET | SUBLINGUAL | Status: AC
  Filled 2020-03-26: qty 1

## 2020-03-26 MED ORDER — IOHEXOL 350 MG/ML SOLN
80.0000 mL | Freq: Once | INTRAVENOUS | Status: AC | PRN
  Administered 2020-03-26: 13:00:00 80 mL via INTRAVENOUS

## 2020-03-26 MED ORDER — NITROGLYCERIN 0.4 MG SL SUBL
0.8000 mg | SUBLINGUAL_TABLET | Freq: Once | SUBLINGUAL | Status: AC
  Administered 2020-03-26: 0.8 mg via SUBLINGUAL

## 2021-02-28 IMAGING — CT CT HEART MORP W/ CTA COR W/ SCORE W/ CA W/CM &/OR W/O CM
4 of 7 series · 8 of 20 positions shown, 9 images · IV contrast (APPLIED)
Comparison: Chest radiograph 05/25/2019. CT chest 06/20/2018. Both
from [facility].
COMPARISON: Chest radiograph 05/25/2019. CT chest 06/20/2018. Both
from [facility].

Addendum:
EXAM:
OVER-READ INTERPRETATION  CT CHEST

The following report is an over-read performed by radiologist Dr.
Nola Oxendine [REDACTED] on 03/26/2020. This over-read
does not include interpretation of cardiac or coronary anatomy or
pathology. The coronary CTA interpretation by the cardiologist is
attached.
CLINICAL DATA: 73-year-old female with h/o hypertension and
atypical chest pain.
Cardiac/Coronary  CTA
TECHNIQUE: The patient was scanned on a Phillips Force scanner.

[Series 6: best diast 66 % · axial · 0.37mm/px · z∈[-269,-236]mm · 2 of 253 slices shown, 3 images]
[im 85/253  vessel]
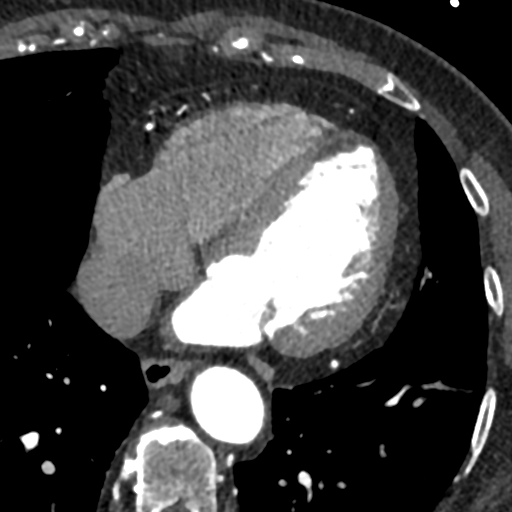
[im 85/253  lung]
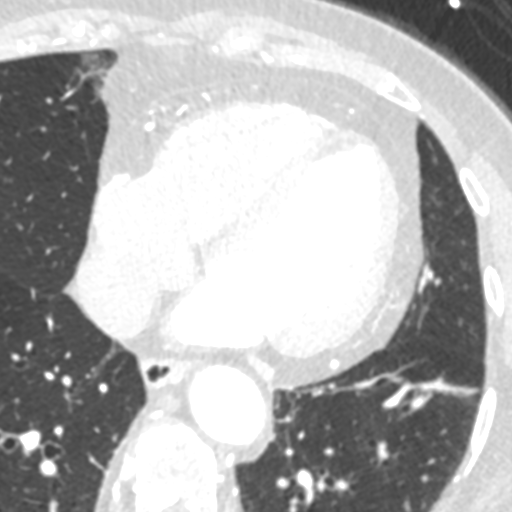
[im 169/253  vessel]
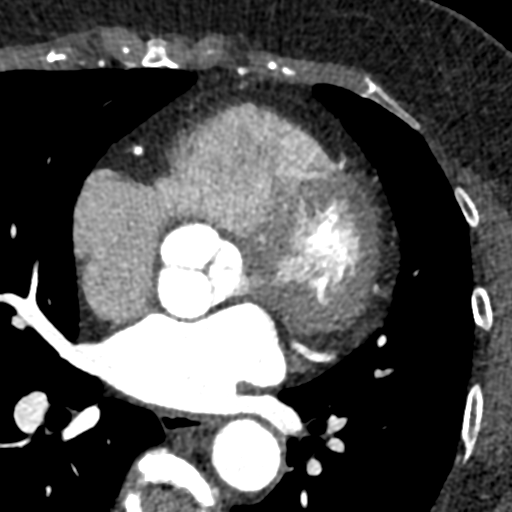

[Series 7: best syst 41 % · axial · 0.37mm/px · z∈[-269,-236]mm · 2 of 253 slices shown]
[im 85/253  vessel]
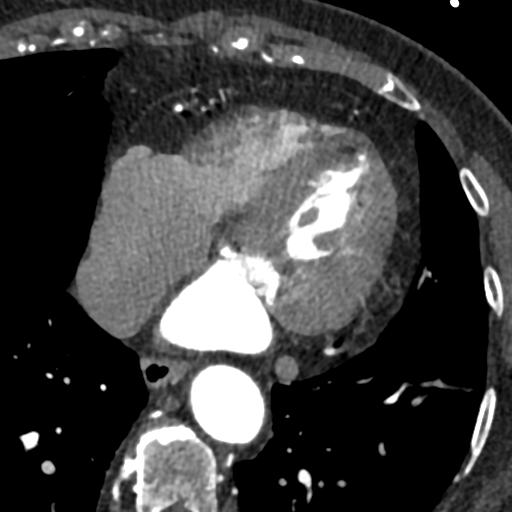
[im 169/253  vessel]
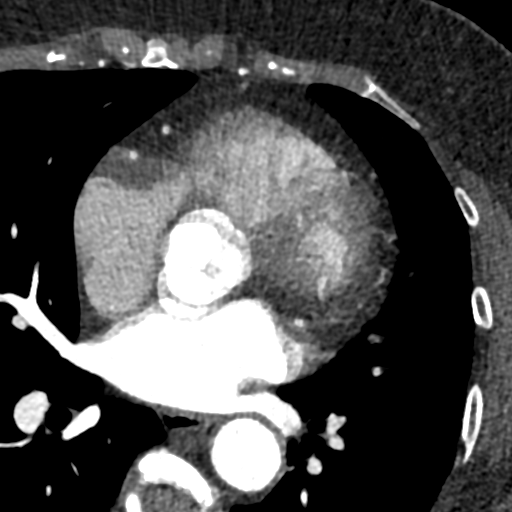

[Series 8: ts diast sharp 41 % · axial · 0.37mm/px · z∈[-269,-236]mm · 2 of 253 slices shown]
[im 85/253  lung]
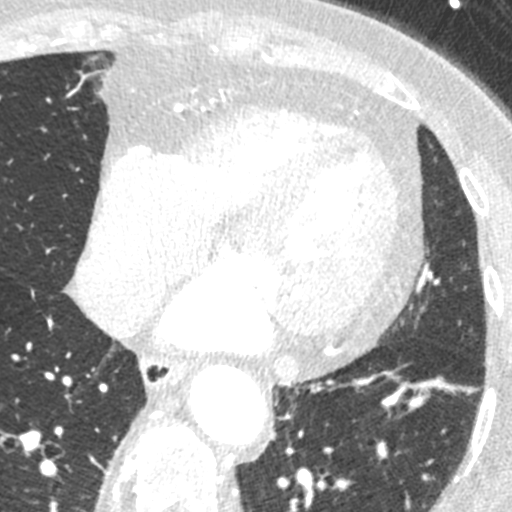
[im 169/253  lung]
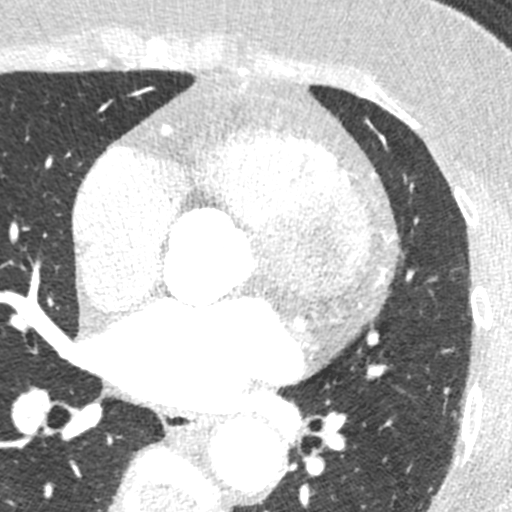

[Series 9: ts syst sharp 41 % · axial · 0.37mm/px · z∈[-269,-236]mm · 2 of 253 slices shown]
[im 85/253  lung]
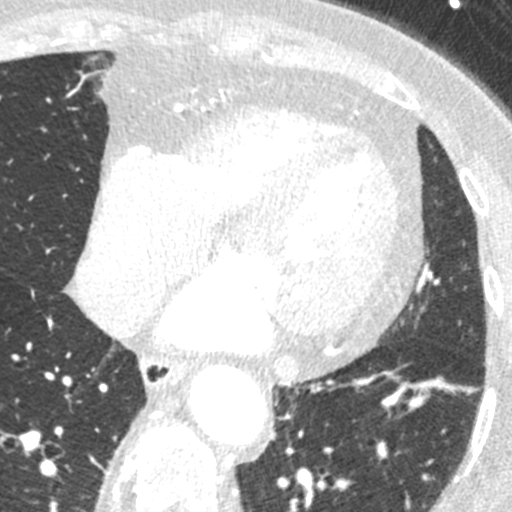
[im 169/253  lung]
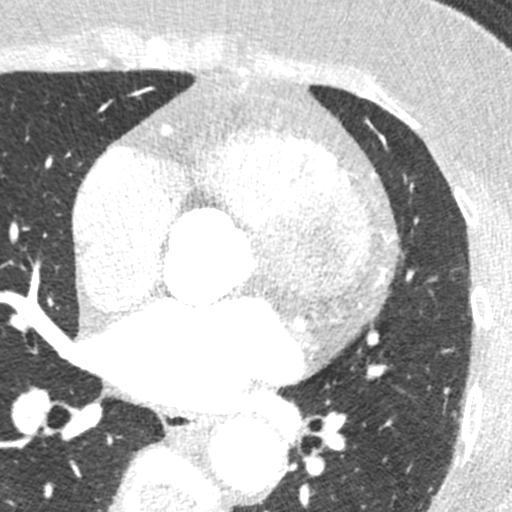

[8 of 20 positions shown; findings below may reference images not displayed]

FINDINGS: Vascular: Normal aortic caliber. No aortic dissection. Aortic
atherosclerosis. No central pulmonary embolism, on this
non-dedicated study.

Mediastinum/Nodes: No imaged thoracic adenopathy.

Lungs/Pleura: No pleural fluid. Bibasilar scarring.

Upper Abdomen: Normal imaged portions of the liver, spleen, stomach.

Musculoskeletal: Midthoracic spondylosis.
IMPRESSION: No acute findings in the imaged extracardiac chest. Aortic
Atherosclerosis (TFT4W-0EZ.Z).
FINDINGS: A 100 kV prospective scan was triggered in the descending thoracic
aorta at 111 HU's. Axial non-contrast 3 mm slices were carried out
through the heart. The data set was analyzed on a dedicated work
station and scored using the Agatson method. Gantry rotation speed
was 250 msecs and collimation was .6 mm. No beta blockade and 0.8 mg
of sl NTG was given. The 3D data set was reconstructed in 5%
intervals of the 67-82 % of the R-R cycle. Diastolic phases were
analyzed on a dedicated work station using MPR, MIP and VRT modes.
The patient received 80 cc of contrast.

Aorta: Normal size of the visualized portion of the ascending aorta.
Mild diffuse atherosclerotic plaque and calcification No dissection.

Aortic Valve:  Trileaflet.  Trivial calcifications.

Coronary Arteries:  Normal coronary origin.  Right dominance.

RCA is a large dominant artery that gives rise to PDA and PLA. There
is minimal calcified plaque in the proximal RCA with stenosis < 24%.

Left main is a large artery that gives rise to LAD and LCX arteries.
Let main has no plaque.

LAD is a large vessel that gives rise to one diagonal artery and has
no plaque.

LCX is a non-dominant artery that gives rise to two large OM
branches. There are minimal irregularities.

Other findings:

Normal pulmonary vein drainage into the left atrium.

Normal left atrial appendage without a thrombus.

Upper normal size of the pulmonary artery measuring 30 mm.
IMPRESSION: 1. Coronary calcium score of 3. This was 36 percentile for age and
sex matched control.

2. Normal coronary origin with right dominance.

3. CAD-RADS 1. Minimal non-obstructive CAD (0-24%). Consider
non-atherosclerotic causes of chest pain. Consider preventive
therapy and risk factor modification.

*** End of Addendum ***
EXAM:
OVER-READ INTERPRETATION  CT CHEST

The following report is an over-read performed by radiologist Dr.
Nola Oxendine [REDACTED] on 03/26/2020. This over-read
does not include interpretation of cardiac or coronary anatomy or
pathology. The coronary CTA interpretation by the cardiologist is
attached.
FINDINGS: Vascular: Normal aortic caliber. No aortic dissection. Aortic
atherosclerosis. No central pulmonary embolism, on this
non-dedicated study.

Mediastinum/Nodes: No imaged thoracic adenopathy.

Lungs/Pleura: No pleural fluid. Bibasilar scarring.

Upper Abdomen: Normal imaged portions of the liver, spleen, stomach.

Musculoskeletal: Midthoracic spondylosis.
IMPRESSION: No acute findings in the imaged extracardiac chest. Aortic
Atherosclerosis (TFT4W-0EZ.Z).

## 2021-04-02 ENCOUNTER — Encounter: Payer: Self-pay | Admitting: Counselor

## 2021-06-28 ENCOUNTER — Encounter: Payer: Non-veteran care | Admitting: Counselor

## 2021-07-05 ENCOUNTER — Encounter: Payer: Non-veteran care | Admitting: Counselor

## 2021-08-14 ENCOUNTER — Encounter: Payer: Self-pay | Admitting: Counselor

## 2021-08-14 ENCOUNTER — Ambulatory Visit (INDEPENDENT_AMBULATORY_CARE_PROVIDER_SITE_OTHER): Payer: No Typology Code available for payment source | Admitting: Counselor

## 2021-08-14 ENCOUNTER — Other Ambulatory Visit: Payer: Self-pay

## 2021-08-14 DIAGNOSIS — F439 Reaction to severe stress, unspecified: Secondary | ICD-10-CM | POA: Diagnosis not present

## 2021-08-14 DIAGNOSIS — Z634 Disappearance and death of family member: Secondary | ICD-10-CM | POA: Diagnosis not present

## 2021-08-14 DIAGNOSIS — F331 Major depressive disorder, recurrent, moderate: Secondary | ICD-10-CM | POA: Diagnosis not present

## 2021-08-14 DIAGNOSIS — R419 Unspecified symptoms and signs involving cognitive functions and awareness: Secondary | ICD-10-CM | POA: Diagnosis not present
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
## 2021-08-15 NOTE — Progress Notes (Signed)
NEUROPSYCHOLOGICAL TEST SCORES [facility]  Patient Name: Brandi Hendrix MRN: [id number] Date of Birth: Jul 15, 1946 Age: 75 y.o. Education: 15 years  Measurement properties of test scores: IQ, Index, and Standard Scores (SS): Mean = 100; Standard Deviation = 15 Scaled Scores (Ss): Mean = 10; Standard Deviation = 3 Z scores (Z): Mean = 0; Standard Deviation = 1 T scores (T); Mean = 50; Standard Deviation = 10  TEST SCORES:    Note: This summary of test scores accompanies the interpretive report and should not be interpreted by unqualified individuals or in isolation without reference to the report. Test scores are relative to age, gender, and educational history as available and appropriate.   Performance Validity        "A" Random Letter Test Raw  Descriptor      Errors 0 Within Expectation  The Dot Counting Test: 11 Within Expectation  NAB Effort Index 2 Within Expectation      Mental Status Screening     Total Score Descriptor  MMSE 24 MCI      Expected Functioning        Wide Range Achievement Test: Standard/Scaled Score Percentile      Word Reading 93 32      [place] Intellectual Screening Test Standard/T-score Percentile      Guess What 36 8      Odd Item Out 36 8  RIST Index 81 10      Attention/Processing Speed        Neuropsychological Assessment Battery (Attention Module, Form 1): T-score Percentile      Digits Forward 48 42      Digits Backwards 37 9      Repeatable Battery for the Assessment of Neuropsychological Status (Form A): Scaled Score Percentile      Coding 6 9      Language        Neuropsychological Assessment Battery (Language Module, Form 1): T-score Percentile      Naming   (29) 47 38      Verbal Fluency: T-score Percentile      Controlled Oral Word Association (F-A-S) 41 18      Semantic Fluency (Animals) 53 62      Memory:        Neuropsychological Assessment Battery (Memory Module, Form 1): T-score Percentile      List  Learning           List A Immediate Recall   (4, 5, 7) 36 8         List B Immediate Recall   (3) 42 21         List A Short Delayed Recall   (4) 35 7         List A Long Delayed Recall   (4) 37 9         List A Percent Retention   (100 %) --- 58         List A Long Delayed Yes/No Recognition Hits   (8) --- <1         List A Long Delayed Yes/No Recognition False Alarms   (6) --- 31         List A Recognition Discriminability Index --- 8      Story Learning           Immediate Recall   (20, 26) 35 7         Delayed Recall   (22) 35 7  Percent Retention   (85 %) --- 38      Daily Living Memory            Immediate Recall   (19, 17) 40 16          Delayed Recall   (8, 3) 40 16          Percent Retention (73 %) --- 21          Recognition Hits    (9) --- 62      Repeatable Battery for the Assessment of Neuropsychological Status (Form A): Scaled Score Percentile         Figure Recall   (10) 8 25      Visuospatial/Constructional Functioning        Repeatable Battery for the Assessment of Neuropsychological Status (Form A): Standard/Scaled Score Percentile     Visuospatial/Constructional Index 87 19         Figure Copy   (18) 10 50         Judgment of Line Orientation   (12) --- 3-9      Executive Functioning        Modified [employer] Test (MWCST): Standard/T-Score Percentile      Number of Categories Correct DC DC      Number of Perseverative Errors DC DC      Number of Total Errors DC DC      Percent Perseverative Errors DC DC  Executive Function Composite DC DC      Trail Making Test: T-Score Percentile      Part A 46 34      Part B 51 54      [place] Diagnostic Aphasia Exam: Raw Score Scaled Score      Complex Ideational Material 10 7      Clock Drawing Raw Score Descriptor      Command 8 Borderline Impairment      Rating Scales         Raw Score Descriptor  Patient Health Questionnaire - 9 23 Severe  GAD-7 18 Severe      Quick Dementia Rating  System Raw Score Descriptor      Sum of Boxes 12 Moderate Dementia      Total Score 21 Severe Dementia   Ladarien Beeks V. Roseanne Reno PsyD, ABN Clinical Neuropsychologist

## 2021-08-16 NOTE — Progress Notes (Signed)
NEUROPSYCHOLOGICAL EVALUATION [facility]  Patient Name: Brandi Hendrix MRN: [id number] Date of Birth: 09-17-1946 Age: 75 y.o. 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 Her husband is concerned about her memory and she acknowledged noticing problems but denied being as concerned about them as he is. She was referred by her neurological care providers at the [place]. The main symptom she reports is memory loss although. She also has very significant affective issues and there is a great deal of conflict in her relationship with her husband.   Neuropsychological assessment shows scores on the RIST index that temper expectations for the patient's cognitive test performance with unusually low verbal and visual performance. She had other low scores on cognitive tests that can be summed up as reflecting difficulties with executive control, with weak performance on measures of processing speed, working memory, and encoding based memory difficulties. These findings are best characterized as weak as opposed to impaired given premorbid status. She is reporting severe levels of depression and anxiety symptoms with very high absolute levels of symptom elevation.   Day-to-day cognitive symptoms are most likely on the basis of affective issues. Her performance on neuropsychological assessment is at most very minimally impaired and given premorbid status, I do not feel that I can confidently diagnose her with a formal neurocognitive disorder. I do not have neuroimaging but suspect that it has been ordered (if not, that would be a reasonable next step in her workup). I also assume that reversible causes e(e.g., vitamin deficiencies, metabolic derangements) have already been fully investigated and corrected. She is already seeing psychiatry and I recommend that she consider psychology  for psychotherapeutic treatment of her depression and/or couples counseling given conflict between the patient and her husband. I do not think that the findings are concerning for any neurodegeneration (specifically, Alzheimer's disease). Behavioral activation may also be helpful.   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 This includes less than expected performance on memory measures, and on tests involving working memory and processing efficiency. I would like to reassure you that none of these findings are concerning for a neurodegenerative condition, such as Alzheimer's disease. Executive control problems are in fact the most common type of cognitive difficulties that I see in my population of individuals without a primary underlying neurologic cause for their cognitive complaints.  Executive control is a higher order cognitive ability involved in regulating other cognitive resources. Much like the conductor of an orchestra coordinates multiple instruments to make music, executive capacities coordinate other lower-order skills (e.g., movement, language, attention) to form complex human behaviors. Individuals with executive control problems are often capable of doing most of the things they did before they were having problems, but they may not do so as effortlessly, efficiently, and consistently. These difficulties often manifest as problems tracking information, multitasking, and paying attention. Executive control problems often result in cognitive inefficiency and can present as "memory problems," because they decrease encoding and spontaneous retrieval of information.   In your case, I think the executive control difficulties  are most likely due to multiple factors, not the least of which are your significant difficulties with depression, anxiety, and trauma-related symptomatology.  etting these under the best control possible is likely the best way to get yourself back on track.  Depression can affect cognitive functioning in several ways. For one, there are neurobiological changes in depression that can contribute to attention, concentration, and memory problems. These changes are so common they are part of the criteria we use to diagnose depressive disorders. Depression can also negatively impact your own appraisal of your cognitive abilities, leading you to feel like you are performing more poorly than is objectively warranted.  You are already following with psychiatry. You might also consider establishing with psychology/psychotherapist in order to work on your depression and/or couples counseling, to reduce level of conflict between you and your husband.  There is a significant research base and evidence of effectiveness for something called "behavioral activation," which is a fancy way of saying that you should increase your activity level. In general, people do not feel as happy or do as well when they are not doing much. This can include things like getting out for walks, re-engaging in hobbies, spending time with family or friends, or learning a new hobby. It's not so important what you do as that you enjoy it and stick with it. Depression can start a vicious cycle where you are not doing a lot because you don't feel well, which leads to less things to be excited and happy about, and thus more depression and behavioral avoidance. It can be hard to change this pattern once it has started but most people find that they feel better when they start doing more even if they don't enjoy it at first.   I cannot underscore how important it is that you take your medications regularly and as prescribed by your medical care  providers. If you do not wish to take medications prescribed by your doctors, then you must let them know, or they cannot adequately manage your care.  Difficulties of the sort that you describe and that you demonstrated on testing are often responsive to mindfulness. Most fundamentally, mindfulness involves taking a non-judgmental stance to the unfolding of the present moment and simply noticing your experience. When you are engaged in a task, this includes dedicating yourself to the task fully, without judgment, and simply redirecting yourself back to the task if you find your mind wandering. Various mindfulness practices including mindfulness based stress reduction, mindfulness medication, and the like have been shown to create subjective and objective improvements in cognitive function. You might consider picking up a copy of "Falling Awake: How to Practice Mindfulness in Everyday Life," by Diamantina Monks, Ph.D., who is one of the best known and most respected authors on the subject.  You might consider picking up a copy of Improving Your Memory: How to Remember What You're Starting to Forget, by Felipa Evener and Letta Kocher, [facility], 2005; effectiveness of using this book may be enhanced with individual counseling reviewing the related behavioral and cognitive strategies.   Of course, if you feel that your issues are worsening or your providers are concerned, you can return for repeat evaluation in 1 to 2 years.  If you do not notice any significant decline then I do not think that you need to return for reevaluation.  Test Findings  Test scores are summarized in additional documentation associated with this encounter. Test scores are relative to age, gender, and educational history as available and appropriate. There were no concerns about performance validity as all findings fell within normal expectations.  General Intellectual Functioning/Achievement:  Performance on  single word reading was towards the low end of the average range. The RIST index was at the lower most extreme of the low average range (straddling the unusually low range) with comparable unusually low scores on the visual and verbal subtests. These findings do temper expectations for the patient's cognitive test performance.  Attention and Processing Efficiency: Performance was weak on measures of attention and working memory with average digit repetition forward and low average (almost unusually low) performance on digit repetition backward. Timed number-symbol coding was unusually low. Performance on simple numeric sequencing was average.  Language: Performance on visual object confrontation naming was within normal limits. Brandi Hendrix demonstrated low average generation of words in response to the letters F-A-S. Generation of words in response to the category prompt "animals" was average.  Visuospatial Function: Performance fell at a low average level on measures of visuospatial/constructional functioning. She performed nearly without error with an average range score on figure copy. Judgment of angular line orientations was unusually low. It is unclear to me whether this score reflects difficulties with test task instructions/confusion or actual difficulties in abilities underlying the task.  Learning and Memory: Performance on measures of learning and memory was at the cusp of abnormal, although considering her overall performance on the RIST index, I think these findings are weak as opposed to frankly impaired.  Her primary difficulties are with respect to memory encoding (she had no difficulties with retention of information over time).  In the verbal realm, Brandi Hendrix learned 4, 5, and 7 words of a 12 item word list across 3 learning trials, which is unusually low. Her short and long delayed recall for the words clustered around unusually low range. Recognition discriminability from the words  from the list versus false choices was also unusually low. Memory for a short story was similar, with unusually low scores for both immediate and delayed recall. Her memory for brief daily-living type information was low average on immediate and delayed recall. Her delayed recognition for this information was average.  Executive Functions: Performance on executive indicators was within normal limits, although this is with the caveat that she discontinued the modified [place] card sorting test after only completing 1 category. Alternating sequencing of numbers and letters was average. Reasoning with verbal information was low average on the complex ideational material. Clock drawing was consistent with "borderline impairment" with the position of the clock hands switched. Generation of words in response to the letters F-A-S was low average.  Rating Scale(s): Brandi Hendrix reported severe levels of depression and anxiety symptoms on self-rating inventories. Her scores fell near the upper most possible limit, which may reflect some tendencies towards over emphasis of negative information, although given that these measures do not include indicators of response style that is qualitative. 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  Brandi Boeck Roseanne Reno, PsyD, ABN Clinical Neuropsychologist  Coding and Compliance  Billing below reflects technician time, my direct face-to-face time with the patient, time spent in test administration, and time spent in professional activities including but not limited to: neuropsychological test interpretation, integration of neuropsychological test data with clinical history, report preparation, treatment planning, care coordination, and review of diagnostically pertinent medical history or studies.   Services associated with this encounter: Clinical  Interview [phone]) plus 155 minutes (96132/96133; Neuropsychological Evaluation by Professional)  140 minutes (  96136/96137; Test Administration by Professional)

## 2021-08-22 ENCOUNTER — Encounter: Payer: Self-pay | Admitting: Counselor

## 2021-08-22 ENCOUNTER — Ambulatory Visit (INDEPENDENT_AMBULATORY_CARE_PROVIDER_SITE_OTHER): Payer: No Typology Code available for payment source | Admitting: Counselor

## 2021-08-22 ENCOUNTER — Other Ambulatory Visit: Payer: Self-pay

## 2021-08-22 DIAGNOSIS — F418 Other specified anxiety disorders: Secondary | ICD-10-CM

## 2021-08-22 DIAGNOSIS — F439 Reaction to severe stress, unspecified: Secondary | ICD-10-CM

## 2021-08-22 NOTE — Progress Notes (Signed)
   NEUROPSYCHOLOGY FEEDBACK NOTE Brandi Hendrix  Feedback Note: I met with Brandi Hendrix to review the findings resulting from her neuropsychological evaluation. Since the last appointment, she has been about the same.Time was spent reviewing the impressions and recommendations that are detailed in the evaluation report. We discussed impression of essentially normal performance given premorbid status, with some areas of weakness that are likely picking up on the day-to-day problems she notices. I described the role of executive control in determining cognitive performance. Recommended greater engagement in counseling and psychiatric care, other recommendations as reflected in the patient instructions. I took time to explain the findings and answer all the patient's questions. I encouraged Brandi Hendrix to contact me should she have any further questions or if further follow up is desired.   Current Medications and Medical History   Current Outpatient Medications  Medication Sig Dispense Refill   amLODipine (NORVASC) 10 MG tablet Take 10 mg by mouth daily.     Ascorbic Acid (VITAMIN C PO) Take 1 tablet by mouth daily.     aspirin EC 81 MG EC tablet Take 1 tablet (81 mg total) by mouth daily.     atenolol (TENORMIN) 50 MG tablet Take 1 tablet (50 mg total) by mouth daily. 30 tablet 11   atorvastatin (LIPITOR) 80 MG tablet Take 1 tablet (80 mg total) by mouth daily at 6 PM. 30 tablet 11   diclofenac (VOLTAREN) 75 MG EC tablet Take 75 mg by mouth 2 (two) times daily as needed for moderate pain.     GINKGO BILOBA PO Take 1 tablet by mouth daily.     hydrochlorothiazide (HYDRODIURIL) 25 MG tablet Take 25 mg by mouth daily.     latanoprost (XALATAN) 0.005 % ophthalmic solution Place 1 drop into both eyes daily.      Multiple Vitamins-Minerals (MULTIVITAMIN PO) Take 1 tablet by mouth daily.     propranolol (INDERAL) 10 MG tablet Take 1 tablet (10 mg total) by mouth 4 (four) times daily as needed  (fast heart rate). 30 tablet 3   No current facility-administered medications for this visit.    Patient Active Problem List   Diagnosis Date Noted   SVT (supraventricular tachycardia) ([place]) 03/28/2017   Chest pain with high risk of acute coronary syndrome    Slow transit constipation    Essential hypertension     Mental Status and Behavioral Observations  Brandi Hendrix presented on time to the present encounter and was alert and generally oriented. Speech was normal in rate, rhythm, volume, and prosody. Self-reported mood was "good" and affect was neutral. Thought process was circumstantial and thought content was appropriate. There were no safety concerns identified at today's encounter, such as thoughts of harming self or others.   Plan  Feedback provided regarding the patient's neuropsychological evaluation. She provided verbal assent for me to fax a copy of her report to [facility] at the [place] [phone]), which I did. Discussing mindfulness and other self-help interventions, as reflected in patient instructions. Brandi Hendrix was encouraged to contact me if any questions arise or if further follow up is desired.   Brandi Hendrix Brandi Kindred, PsyD, ABN Clinical Neuropsychologist  Service(s) Provided at This Encounter: 44 minutes [phone]; Psychotherapy with patient/family)

## 2021-08-22 NOTE — Patient Instructions (Addendum)
Your performance and presentation on neuropsychological assessment were consistent with low test scores can be summed up as primarily reflecting difficulties with executive control. This includes less than expected performance on memory measures, and on tests involving working memory and processing efficiency. I would like to reassure you that none of these findings are concerning for a neurodegenerative condition, such as Alzheimer's disease. Executive control problems are in fact the most common type of cognitive difficulties that I see in my population of individuals without a primary underlying neurologic cause for their cognitive complaints.  Executive control is a higher order cognitive ability involved in regulating other cognitive resources. Much like the conductor of an orchestra coordinates multiple instruments to make music, executive capacities coordinate other lower-order skills (e.g., movement, language, attention) to form complex human behaviors. Individuals with executive control problems are often capable of doing most of the things they did before they were having problems, but they may not do so as effortlessly, efficiently, and consistently. These difficulties often manifest as problems tracking information, multitasking, and paying attention. Executive control problems often result in cognitive inefficiency and can present as "memory problems," because they decrease encoding and spontaneous retrieval of information.    In your case, I think the executive control difficulties are most likely due to multiple factors, not the least of which are your significant difficulties with depression, anxiety, and trauma-related symptomatology. Getting these under the best control possible is likely the best way to get yourself back on track.  Depression can affect cognitive functioning in several ways. For one, there are neurobiological changes in depression that can contribute to attention,  concentration, and memory problems. These changes are so common they are part of the criteria we use to diagnose depressive disorders. Depression can also negatively impact your own appraisal of your cognitive abilities, leading you to feel like you are performing more poorly than is objectively warranted.  You are already following with psychiatry. You might also consider establishing with psychology/psychotherapist in order to work on your depression and/or couples counseling, to reduce level of conflict between you and your husband.  There is a significant research base and evidence of effectiveness for something called "behavioral activation," which is a fancy way of saying that you should increase your activity level. In general, people do not feel as happy or do as well when they are not doing much. This can include things like getting out for walks, re-engaging in hobbies, spending time with family or friends, or learning a new hobby. It's not so important what you do as that you enjoy it and stick with it. Depression can start a vicious cycle where you are not doing a lot because you don't feel well, which leads to less things to be excited and happy about, and thus more depression and behavioral avoidance. It can be hard to change this pattern once it has started but most people find that they feel better when they start doing more even if they don't enjoy it at first.    I cannot underscore how important it is that you take your medications regularly and as prescribed by your medical care providers. If you do not wish to take medications prescribed by your doctors, then you must let them know, or they cannot adequately manage your care.  Difficulties of the sort that you describe and that you demonstrated on testing are often responsive to mindfulness. Most fundamentally, mindfulness involves taking a non-judgmental stance to the unfolding of the present moment and  simply noticing your experience.  When you are engaged in a task, this includes dedicating yourself to the task fully, without judgment, and simply redirecting yourself back to the task if you find your mind wandering. Various mindfulness practices including mindfulness based stress reduction, mindfulness medication, and the like have been shown to create subjective and objective improvements in cognitive function. You might consider picking up a copy of "Falling Awake: How to Practice Mindfulness in Everyday Life," by Diamantina Monks, Ph.D., who is one of the best known and most respected authors on the subject.   You might consider picking up a copy of Improving Your Memory: How to Remember What You're Starting to Forget, by Felipa Evener and Letta Kocher, [facility], 2005; effectiveness of using this book may be enhanced with individual counseling reviewing the related behavioral and cognitive strategies.    Of course, if you feel that your issues are worsening or your providers are concerned, you can return for repeat evaluation in 1 to 2 years.  If you do not notice any significant decline then I do not think that you need to return for reevaluation.

## 2023-04-25 NOTE — Interval H&P Note (Signed)
H&P reviewed. The patient was examined and there are no changes to the H&P.

## 2023-04-25 NOTE — Unmapped (Signed)
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    COMPLICATIONS: None.    EBL: 50 cc    OPERATIVE INDICATIONS: Patient is a very nice 77 year old woman who has a history of abdominal pain.  She was found to have perforated viscus on CT imaging and peritonitis on exam.  I suspect she would need to have an emergent exploration in the operating room and the risk including bleeding, infection, anastomotic leak should anastomosis be made, need for  temporary or permanent stoma, MI, stroke, even death.  She understood and would like to proceed.    OPERATIVE DETAIL: The patient was brought to the operative table and placed in supine position.  She was identified and general seizure was induced.  Her abdomen was prepped and draped in usual fashion using ChloraPrep and timeout was performed.  A midline  laparotomy incision was made and dissection was carried out to the level of fascia which was opened under direct visualization to gain access to the peritoneal cavity.  Once this was done, the peritoneal cavity had adhesions to the anterior abdominal wall that were lysed in order to gain access fully.  Adhesiolysis was necessary in order to complete the operation.  This was mostly of the omentum.  The  falciform ligament was then taken down with a Kelly clamp and 0 Vicryl tie x 2 and the abdomen is explored  fully.  Stomach was seen to be without perforation.  There was feculent  material that was in the pelvis and this was cultured.  The small bowel was then run from the ligament of Treitz proximally and there was multiple areas where there was a rind on the bowel but there was no evidence of perforation.  Next, the sigmoid colon was seen to be significantly inflamed and likely a cause of the perforation.  The white line Toldt was mobilized from lateral to mid fashion using the Bovie cautery.  The sigmoid colon was delivered into the field and an area of the sigmoid colon was seen to be perforated.  The dissection was carried out to level of the healthy bowel distal to the perforation and the Kelly clamp was used to the, underneath the bowel about the mesentery.  Contour green loads stable from was used to divide the bowel.  The mesentery is then controlled with LigaSure device.  Proximally there was healthy bowel and this was also divided with a contour green load and an additional firing.  The bowel was passed off the field specimen the ureter was identified in the retroperitoneum and was preserved.  At this point the rectal stump was oversewn with 2-0 PDS suture in running fashion.  The abdomen was irrigated with 6 L of saline in all 4 quadrants in the pelvis to perform a therapeutic peritoneal lavage once this was done the peritoneal surfaces appeared clean.  There was residual rind on the small bowel but no additional feculent material was seen.  At this point a quarter sized piece of skin was excised and the left mid abdomen and dissection was carried out to level of fascia which was opened in a cruciate fashion.  The rectus muscle split and the posterior rectus fascia is opened in a cruciate fashion.  The colon was brought out of the abdomen for later maturation and following this the fascia was closed with #1 looped PDS in running fashion x 2.  Subcutaneous tissue was irrigated with saline and hemostasis was obtained with Bovie cautery.  The skin was then closed loosely with  staples.  Next, the stoma was matured with 3-0 Vicryl sutures in interrupted fashion.  The stoma appliance was applied.  At the conclusion all sharps, sponge, and instrument counts were correct.  The patient tolerated the procedure well and was transferred to the PACU in extubated and stable condition.

## 2023-04-25 NOTE — OR Surgeon (Signed)
Pre-Procedure patient identification:  I am the primary operating surgeon/proceduralist and I have reviewed the applicable pathology reports and radiology studies for this procedure. I have identified the patient on 04/25/23 at 9:28 PM Schoonyoung, Griffin Dakin, MD  Phone Number: [phone]

## 2023-04-25 NOTE — ED Provider Notes (Signed)
Emergency Medicine Note  HPI  HISTORY OF PRESENT ILLNESS     77 year old female presents to the ER with upper abdominal pain that started 2 days ago.  Pain has been constant moderate to severe without radiation to the back.  There was associated nausea and vomiting along with diarrhea in the last 2 days.  Nothing is made the symptoms better or worse.  There is been no blood in the vomitus or diarrhea.  She never had this kind of pain before.  She states she has her gallbladder out years ago.  She was scheduled to get a colonoscopy soon.          Patient History  PAST HISTORY     Reviewed from Nursing Triage:  Allergies       Past Medical History:   Diagnosis Date   . Anemia    . Depression    . Diverticulitis of colon    . GERD (gastroesophageal reflux disease)    . Hypertension    . Osteoporosis    . Pulmonary embolism (CMS/HCC) 06/2018       No past surgical history on file.    No family history on file.    Social History     Tobacco Use   . Smoking status: Former     Types: Cigarettes   . Smokeless tobacco: Never   Substance Use Topics   . Alcohol use: Not Currently         Review of Systems  REVIEW OF SYSTEMS     Review of Systems   Constitutional:  Negative for fatigue and fever.   Respiratory:  Negative for chest tightness and shortness of breath.    Cardiovascular:  Negative for chest pain.   Gastrointestinal:  Positive for abdominal pain, diarrhea, nausea and vomiting.   Genitourinary:  Negative for flank pain.   Musculoskeletal:  Negative for back pain.   Neurological:  Negative for light-headedness.   Psychiatric/Behavioral:  Negative for behavioral problems.          VITALS     ED Vitals      Date/Time Temp Pulse Resp BP SpO2 Who   04/25/23 2027 -- 105* 26* 106/61 93 % MS   04/25/23 1906 -- 101* 26* 110/59* 90 %* PB   04/25/23 1830 -- 99 20 97/61 92 % JA   04/25/23 1735 -- 97 20 105/59* 92 % JA   04/25/23 1511 37.3 C (99.1 F) 129* 20 90/50* 94 % JB          Pulse Ox %: 94 % (04/25/23 1554)  Pulse Ox  Interpretation: Normal (04/25/23 1554)  Heart Rate: 1219 (04/25/23 1554)  Rhythm Strip Interpretation: Sinus Tachycardia (04/25/23 1554)     Physical Exam  PHYSICAL EXAM     Physical Exam  Vitals and nursing note reviewed.   Constitutional:       General: She is not in acute distress.     Appearance: She is well-developed.   Eyes:      Conjunctiva/sclera: Conjunctivae normal.   Cardiovascular:      Rate and Rhythm: Normal rate and regular rhythm.      Heart sounds: No murmur heard.  Pulmonary:      Effort: Pulmonary effort is normal. No respiratory distress.      Breath sounds: Normal breath sounds.   Abdominal:      General: There is no distension.      Palpations: Abdomen is soft.      Tenderness: There  is abdominal tenderness. There is guarding. There is no rebound.      Hernia: No hernia is present.      Comments: Quiet bowel sounds   Musculoskeletal:         General: No swelling or tenderness.      Cervical back: Neck supple.   Skin:     General: Skin is warm and dry.   Neurological:      Mental Status: She is alert and oriented to person, place, and time.   Psychiatric:         Mood and Affect: Mood normal.           PROCEDURES     Critical Care    Performed by: Loel Lofty, DO  Authorized by: Loel Lofty, DO    Critical care provider statement:     Critical care time (minutes):  80    Critical care was necessary to treat or prevent imminent or life-threatening deterioration of the following conditions:  Sepsis and shock (free air in abdomen)    Critical care was time spent personally by me on the following activities:  Discussions with consultants, evaluation of patient's response to treatment, gastric intubation, interpretation of cardiac output measurements, ordering and performing treatments and interventions, ordering and review of laboratory studies, ordering and review of radiographic studies, pulse oximetry and re-evaluation of patient's condition       DATA     Results       Procedure  Component Value Units Date/Time    Blood Culture Blood, Venous [[id number]] Collected: 04/25/23 1746    Specimen: Blood, Venous Updated: 04/25/23 1801    Blood Culture Blood, Venous [[id number]] Collected: 04/25/23 1746    Specimen: Blood, Venous Updated: 04/25/23 1801    Lactic acid, Venous [[id number]]  (Abnormal) Collected: 04/25/23 1634    Specimen: Blood, Venous Updated: 04/25/23 1652     Lactate 2.6* mmol/L     CBC and differential [[id number]]  (Abnormal) Collected: 04/25/23 1517    Specimen: Blood, Venous Updated: 04/25/23 1635     WBC 1.96* K/uL      Comment: ALL RESULTS HAVE BEEN CHECKED This result has been called to Marden Noble by [facility] on 04/25/2023 15:58:58, and has been read back.         RBC 5.01 M/uL      Hemoglobin 15.2 g/dL      Hematocrit 64.4* %      MCV 92.0 fL      MCH 30.3 pg      MCHC 33.0 g/dL      RDW 03.4 %      Platelets 256 K/uL      MPV 10.4 fL      Differential Type Manu     Neutrophils 68 %      Lymphocytes 17 %      Monocytes 10 %      Eosinophils 0 %      Basophils 0 %      Bands 4 %      Myelocytes 1 %      Neutrophils, Absolute 1.33* K/uL      Lymphocytes, Absolute 0.33* K/uL      Monocytes, Absolute 0.20* K/uL      Eosinophils, Absolute 0.00* K/uL      Basophils, Absolute 0.00* K/uL      Bands, Absolute 0.08 K/uL      Myelocytes, Absolute 0.02* K/uL      PLT Morphology Normal  Platelet Estimate Adequate (150,000-400,000)     Burr Cells 1+    SARS-COV-2 (COVID-19)/ FLU A/B, AND RSV, PCR Nasopharynx [[id number]]  (Normal) Collected: 04/25/23 1517    Specimen: Nasopharyngeal Swab from Nasopharynx Updated: 04/25/23 1628     SARS-CoV-2 (COVID-19) Negative     Influenza A Negative     Influenza B Negative     Respiratory Syncytial Virus Negative    Narrative:      Testing performed using real-time PCR for detection of COVID-19. EUA approved validation studies performed on site.     Lipase [[id number]]  (Abnormal) Collected: 04/25/23 1517    Specimen: Blood, Venous Updated: 04/25/23  1622     Lipase 3* U/L     HS Troponin I (with 2 hour reflex) [[id number]]  (Normal) Collected: 04/25/23 1517    Specimen: Blood, Venous Updated: 04/25/23 1609     High Sens Troponin I 13.9 pg/mL     Comprehensive metabolic panel [[id number]]  (Abnormal) Collected: 04/25/23 1517    Specimen: Blood, Venous Updated: 04/25/23 1600     Sodium 133* mEQ/L      Potassium 4.7 mEQ/L      Comment: Results obtained on plasma. Plasma Potassium values may be up to 0.4 mEQ/L less than serum values. The differences may be greater for patients with high platelet or white cell counts.        Chloride 98 mEQ/L      CO2 21 mEQ/L      BUN 19 mg/dL      Creatinine 1.3* mg/dL      Glucose 440* mg/dL      Calcium 9.5 mg/dL      AST (SGOT) 38 IU/L      ALT (SGPT) 19 IU/L      Alkaline Phosphatase 40 IU/L      Total Protein 8.1 g/dL      Comment: Test performed on plasma which typically contains approximately 0.4 g/dL more protein than serum.        Albumin 4.2 g/dL      Bilirubin, Total 1.9* mg/dL      eGFR 10.2* VO/ZDG/6.44I*3      Comment: Calculation based on the Chronic Kidney Disease Epidemiology Collaboration (CKD-EPI) equation refit without adjustment for race.        Anion Gap 14 mEQ/L     Rainbow Draw Panel [[id number]] Collected: 04/25/23 1517    Specimen: Blood, Venous Updated: 04/25/23 1542    Narrative:      The following orders were created for panel order Rainbow Draw Panel.  Procedure                               Abnormality         Status                     ---------                               -----------         ------                     RAINBOW LT OVFI[[id number]]                                  In process  Please view results for these tests on the individual orders.    RAINBOW LT BLUE [[id number]] Collected: 04/25/23 1517    Specimen: Blood, Venous Updated: 04/25/23 1542            Imaging Results              X-RAY ABDOMEN 1 VIEW (In process)                      CT ABDOMEN PELVIS WITH IV CONTRAST  (Final result)  Result time 04/25/23 19:47:46      Final result                   Impression:    IMPRESSION:  1.  The study is markedly abnormal.  2.  There is evidence of frank free air and free fluid consistent with  perforated viscus.  3.  The stomach is markedly dilated as well as the esophagus with suspicion for  an underlying gastric neoplasm.  Correlate clinically and direct visualization  may be helpful to better assess.  4.  There is also marked small bowel dilatation with multiple small bowel loops  demonstrating marked wall thickening particularly in the pelvis, possibly  reactive from the perforation and suspected peritonitis.  5.  Bibasilar consolidation and a tiny right pleural effusion.  6. Marked common bile duct and intrahepatic duct dilatation.    Results discussed with and critically read back by Dr. Frederico Hamman in the emergency  room at 7:40 PM.    STUDY: Axial CT images of the abdomen and pelvis were obtained from the lung  bases to the pubic symphysis.  100 cc of Isovue-370 IV contrast were given.  Images were reviewed in soft tissue, lung and bone windows.    CT DOSE:  One or more dose reduction techniques (e.g. automated exposure  control, adjustment of the mA and/or kV according to the patient size, use of  iterative reconstruction technique) utilized for this examination.    COMMENT:    GI System:  Liver:  There is a hypodensity in the left lobe of the liver measuring 7 mm,  which cannot be further characterized on this study.  Gallbladder and bile ducts:  The common bile duct is markedly dilated  measuring up to 12 mm.  There is also significant intrahepatic duct dilatation.  Gallbladder not seen for certain.  Pancreas:  Normal. Duct measures 2 mm.  Stomach:  The stomach is markedly dilated and fluid-filled and the esophagus  is also markedly dilated and fluid-filled.  There is a questionable gastric mas  does not demonstrate any change in configuration involving the mid body, best  seen on  axial image #48 for which neoplastic process is not excluded.  Small bowel:  Significantly dilated fluid-filled small bowel loops, many the  bowel loops demonstrate also associated wall thickening particularly in the  distal ileal loops within the deep pelvis.  Large bowel:  A few scattered diverticula of the colon.  No definite bowel  wall thickening or findings to suggest acute diverticulitis.    GU System:  Adrenals:  Normal.  Kidneys:  Normal in position and sizewith normal nephrograms, no  hydronephrosis.  Urinary bladder:  Normal.  Uterus and ovaries: Not well seen, may be surgically absent.    RES System:  Spleen:  Normal in size and appearance.  Lymph nodes:  No mesenteric or retroperitoneal lymphadenopathy.  No pelvic  lymphadenopathy.    Other:  Peritoneum: There is  extensive free fluid and frank perforation  demonstrated.  There are portions where there appeared to be some mild  peritoneal enhancement suggesting peritonitis or possibly carcinomatosis.  There  is also a more focal area of fluid in the deep pelvis with air lucencies and  again not and uncertain if this is due to a more discrete abscess or combination  of the free fluid and free air seen throughout the abdomen and pelvis otherwise.  Aorta and iliac arteries:  No atherosclerotic change.  Abdominal wall: Normal.  Lower lungs and pleura:  Infiltrates in the lower lobes bilaterally.  Tiny  right pleural effusion also noted.  Visualized axial skeleton:  Degenerative spondylosis of the lumbar spine.               Narrative:    CLINICAL HISTORY: Abdominal pain.    COMPARISON: None.                                       X-RAY CHEST 1 VIEW (Final result)  Result time 04/25/23 16:01:21      Final result                   Impression:    IMPRESSION: No pulmonary vascular congestion or edema. Bibasilar atelectatic  changes    COMMENT: The current portable study the chest is performed as a baseline  examination at this institution    There is no pulmonary  vascular congestion or edema    Atelectatic changes are likely present at both lung bases. No consolidating  infiltrates are seen. There are no pleural effusions.    The heart size is top normal. The thoracic aorta appears ectatic and uncoiled    The bony thorax is intact.               Narrative:    CLINICAL HISTORY: Midsternal pain and shortness of breath.                                      ECG 12 lead      ECG 12 lead    (Results Pending)       Scoring tools                                  ED Course & MDM  MDM / ED COURSE / CLINICAL IMPRESSION / DISPO     Medical Decision Making  77 year old female presents with upper abdominal pain along with vomiting and diarrhea.  Differential diagnosis includes gastritis, perforation, enteritis and colitis, pancreatitis, malignancy, internal hernia or obstructive process, peritonitis was considered given her abnormal vitals.  Other entities do exist but not listed.    Amount and/or Complexity of Data Reviewed  External Data Reviewed:      Details: No old recs. Pt from [place].  Labs: ordered. Decision-making details documented in ED Course.  Radiology: ordered. Decision-making details documented in ED Course.  ECG/medicine tests: ordered. Decision-making details documented in ED Course.  Discussion of management or test interpretation with external provider(s): D/w Surgery, D/W Anesthesia/ pre op.      Risk  Prescription drug management.  Decision regarding hospitalization.        ED Course as of 04/25/23 2153   Sat  Apr 25, 2023   1620 WBC 1.96 , low. CXR neg. Creat 1.3, no baseline available [SC]   1942 Patient has evidence of free air on CAT scan as per radiology.  She also has what looks like bowel obstruction and a distended stomach. [facility] surgery resident. Will need NGT. Will likely to to OR tonight. I'll reach out to her son.  [SC]   1946 Bcx2 sent. IV zosyn ordered now. Will get NGT. Call to son. WBC 1.96. BP improved.  [SC]   2002 D/w Anesthesia [SC]      ED Course User  Index  [SC] Costalas, Hillery Hunter, DO     Clinical Impression      Intra-abdominal free air of unknown etiology   Neutropenia, unspecified type (CMS/HCC)   Sepsis, due to unspecified organism, unspecified whether acute organ dysfunction present (CMS/HCC)   Hypotension, unspecified hypotension type     _________________       ED Disposition   Send to OR / Endoscopy                       Loel Lofty, DO  04/25/23 2153    *Some images could not be shown.

## 2023-04-28 NOTE — Progress Notes (Signed)
Infectious Disease Progress Note    Patient Name: Heidi Thomas  MR#: [id number]  DOB: 1946-09-10  Admission Date: 04/25/2023  Date: 04/28/23   Time: 3:01 PM   Author: Dois Davenport, MD      Antibiotics:    Anti-infectives (From admission, onward)      Start     Dose/Rate Route Frequency Ordered Stop    04/27/23 2300  piperacillin-tazobactam (ZOSYN) 4.5 g/100 mL IVPB in NSS         4.5 g  33.3 mL/hr over 3 Hours intravenous Every 6 hours interval 04/27/23 1609              Subjective  Mild lower abd pain  Mild sob improved in chair  Objective    Vital Signs:    Visit Vitals  BP (!) 170/77 (BP Location: Left upper arm, Patient Position: Sitting)   Pulse 91   Temp 37.2 C (99 F) (Oral)   Resp 16   Ht 1.676 m (5\' 6" )   Wt 74 kg (163 lb 1.6 oz)   SpO2 94%   BMI 26.33 kg/m       Temp (72hrs), Avg:36.9 C (98.5 F), Min:36.3 C (97.3 F), Max:37.7 C (99.8 F)      Physical Exam:  General: non toxic, alert  HEENT: NC/AT/ anicteric sclera, pharynx clear   Neck: supple  Cardiovascular: regular S1/S2    Respiratory: diminished at bases,   GI/Abdomen: soft, mildly tender at bases, non distended, diminished bs. Wound dressed. Ostomy pink/red  Extremities: no  edema  MSK/JOINTS:  No swelling, erythema, or pain  Neurology and psych: no focal deficits, cooperative with exam  Skin: no rashes, lines clean dry intact  foley    .  Lines, Drains, Airways, Wounds:  Peripheral IV (Adult) 04/25/23 Right Hand (Active)   Number of days: 3       Peripheral IV (Adult) 04/26/23 Anterior;Right Forearm (Active)   Number of days: 2       NG/OG Tube 04/25/23 Right nostril (Active)   Number of days: 3       Colostomy End LLQ (Active)   Number of days: 3       Urethral Catheter Latex 16 Fr (Active)   Number of days: 3       Surgical Incision Abdomen (Active)   Number of days: 3       Labs:    CBC Results         04/28/23 04/27/23 04/26/23     0502 0715 0733    WBC 6.94 8.59 4.42    RBC 3.15* 3.65* 4.02    HGB 9.7* 11.2* 12.4    HCT 28.5* 33.9*  37.8    MCV 90.5 92.9 94.0    MCH 30.8 30.7 30.8    MCHC 34.0 33.0 32.8    PLT 208 188 192           Comment for HGB at 0733 on 04/26/23: ALL RESULTS HAVE BEEN CHECKED            CMP Results         04/28/23 04/27/23 04/27/23     0502 1427 0715    NA 139 -- 141    K 4.0 -- 3.9    Cl 110* -- 109*    CO2 24 -- 25    Glucose 126* -- 89    BUN 16 -- 17    Creatinine 0.7 -- 0.7    Calcium 7.3* -- 7.3*  Anion Gap 5 -- 7    Albumin -- 3.3* --    EGFR >60.0 -- >60.0           Comment for EGFR at 0502 on 04/28/23: Calculation based on the Chronic Kidney Disease Epidemiology Collaboration (CKD-EPI) equation refit without adjustment for race.    Comment for EGFR at 0715 on 04/27/23: Calculation based on the Chronic Kidney Disease Epidemiology Collaboration (CKD-EPI) equation refit without adjustment for race.            MICRO: Reviewed  Microbiology Results (last 3 days)       Procedure Component Value - Date/Time    Anaerobic Culture / Smear (includes Aerobic Culture) Peritoneal Fluid [[id number]]  (Abnormal) Collected: 04/25/23 2159    Lab Status: Preliminary result Specimen: Peritoneal Fluid Updated: 04/28/23 1355     Culture **Positive Culture***      1+ Gram Negative Rods      1+ Gram Negative Rods      1+ Enterococcus faecalis      1+ Enterococcus avium     Comment:   This is an updated result. Previous organism was Enterococcus faecalis on 04/28/2023 at 1354 EDT.        Gram Stain Result 2+ WBC*      2+ Gram negative bacilli*    Narrative:      ISOLATION IN PROGRESS    Fungal Stain Peritoneal Fluid [[id number]] Collected: 04/25/23 2159    Lab Status: Final result Specimen: Peritoneal Fluid Updated: 04/26/23 1051     Fungus Stain No fungal elements seen    Fungal Culture Peritoneal Fluid [[id number]]  (Normal) Collected: 04/25/23 2159    Lab Status: Preliminary result Specimen: Peritoneal Fluid Updated: 04/27/23 0300     Fungal Culture No Fungus Isolated to Date    Blood Culture Blood, Venous [[id number]]  (Normal)  Collected: 04/25/23 1746    Lab Status: Preliminary result Specimen: Blood, Venous Updated: 04/27/23 1901     Culture No growth at 48 hours    Blood Culture Blood, Venous [[id number]]  (Normal) Collected: 04/25/23 1746    Lab Status: Preliminary result Specimen: Blood, Venous Updated: 04/27/23 1901     Culture No growth at 48 hours    SARS-COV-2 (COVID-19)/ FLU A/B, AND RSV, PCR Nasopharynx [[id number]]  (Normal) Collected: 04/25/23 1517    Lab Status: Final result Specimen: Nasopharyngeal Swab from Nasopharynx Updated: 04/25/23 1628     SARS-CoV-2 (COVID-19) Negative     Influenza A Negative     Influenza B Negative     Respiratory Syncytial Virus Negative    Narrative:      Testing performed using real-time PCR for detection of COVID-19. EUA approved validation studies performed on site.            Imaging: REVIEWED      Assessment  This is a 77 y.o. female  Secondary peritonitis from sigmoid perforation-stable exam, high risk of abscess formation  Sepsis due to above-stabilizing/improving  Polymicrobial bacterial infection  Leukopenia of sepsis improving              Plan  Dc foley when able  Continue zosyn  Follow OR cx  Follow CBC  Will follow        *Some images could not be shown.

## 2023-05-05 NOTE — Unmapped (Signed)
Interventional Radiology Brief Postprocedure Note    Heidi Thomas     Attending: Juleen Starr, MD     Assistant: none    Diagnosis: left paracolic gutter collection    Description of procedure: CT guided drainage    Contrast: none     Anesthesia:  Local (Lidocaine)    Volume of Lidocaine Utilized (ml): 8     Medications: IV fentanyl     Complications: None      Estimated Blood Loss: Estimated Blood Loss: None    Anticoagulation: n/a    Specimens: 20 cc sanguinous fluid.    Findings: 10.2 french drain placed in left paracolic gutter collection.    05/05/2023 1:54 PM

## 2023-05-05 NOTE — OR Surgeon (Signed)
Pre-Procedure patient identification:  I am the primary operating surgeon/proceduralist and I have reviewed the applicable pathology reports and radiology studies for this procedure. I have identified the patient on 05/05/23 at 1:54 PM Juleen Starr, MD

## 2023-05-06 NOTE — Progress Notes (Signed)
77 year old female with perforated diverticulitis s/p open hartman procedure 04/25/23 seen bedside 1 day s/p left paracolic gutter drainage by IR on 05/05/23. Today states she's doing alright, has been a busy day with lots of tests. Currently getting blood transfusion. Denies abodminal pain or pain at drain site. No fevers, tolerating po without issues.    Vitals:    05/06/23 1547   BP:    Pulse: 92   Resp: 18   Temp: 37 C (98.6 F)   SpO2: 98%     WDWN female no acute distress  No respiratory distress  Abdomen soft, nontender no guarding  LLQ drain secured in place, 10 ml serous output in bag.    CBC Results         05/06/23 05/05/23 05/04/23     0438 0526 0546    WBC 11.02* 14.47* 14.54*    RBC 2.32* 2.42* 2.59*    HGB 7.1* 7.6* 7.8*    HCT 21.6* 22.8* 23.6*    MCV 93.1 94.2 91.1    MCH 30.6 31.4 30.1    MCHC 32.9 33.3 33.1    PLT 378* 359 297           Comment for HGB at 0438 on 05/06/23: ALL RESULTS HAVE BEEN CHECKED          Cx no growth to date    Output 20 ml serousanginous fluid    A/P: 77 year old female with left paracolic collection seen 1 day s/p LLQ perc drainage. WBC trending down, cx negative to date, low output from drain. Flush 10 ml saline daily, if persistently <15 ml/day can consider drain study/removal. Call IR @[phone]  with questions/concerns.    Time spent with patient: 15 minutes (5 minutes face/face and 10 minutes reviewing record/imaging/labs and documenting in Epic)    *Some images could not be shown.

## 2023-05-07 NOTE — Unmapped (Signed)
Interventional Radiology Brief Postprocedure Note    Heidi Thomas     Attending: Gayla Medicus. Glenna Durand, MD PhD     Assistant: None    Diagnosis: PE, bleeding, LLQ drainage catheter    Description of procedure: IVC filter placement.  Tube check and removal    Contrast: 20 mL Isovue 300     Anesthesia:  Local (Lidocaine), 50 mcg fentanyl IV    Volume of Lidocaine Utilized (ml): 5     Medications: None     Complications: None      Estimated Blood Loss: Estimated Blood Loss: 0-10 ml    Anticoagulation: None    Specimens: None    Findings: Status post IVC filter placement.    Tube check demonstrated no residual cavity. The drain was removed.      05/07/2023 4:43 PM

## 2023-05-07 NOTE — Telephone Encounter (Signed)
Spoke to [employer], nurse case manager @ [facility] in [place]. Patient receiving IVC filter due to weeping of wounds. Xarelto d/c'd. Patient to be discharged this upcoming Sunday. Records received and post op appt made with Dr. Fonnie Jarvis for next week. [facility] orders faxed over for stoma care. Patients PCP is Davy Pique at the [facility]. Phone number [phone].

## 2023-05-07 NOTE — OR Surgeon (Signed)
Pre-Procedure patient identification:  I am the primary operating surgeon/proceduralist and I have reviewed the applicable pathology reports and radiology studies for this procedure. I have identified the patient on 05/07/23 at 4:22 PM Ricky T. Glenna Durand, MD PhD

## 2023-05-12 ENCOUNTER — Encounter: Admit: 2023-05-12 | Discharge: 2023-05-12 | Payer: MEDICARE | Primary: Primary Care

## 2023-05-14 ENCOUNTER — Ambulatory Visit: Admit: 2023-05-14 | Discharge: 2023-05-14 | Attending: Colon & Rectal Surgery | Primary: Primary Care

## 2023-05-14 DIAGNOSIS — K5720 Diverticulitis of large intestine with perforation and abscess without bleeding: Secondary | ICD-10-CM

## 2023-05-14 NOTE — Patient Instructions (Addendum)
CT scan in 2 weeks  Continue Xarelto (blood thinner) and antibiotics  Referral to [facility] for colonoscopy and EGD in 2-3 months rule out possible gastric mass and ensure colon health  See hematology for anticoagulation maintenance, referral to Dr. Eulogio Ditch  Follow up in office in 3 weeks

## 2023-05-14 NOTE — Case Communication (Signed)
one major drug interaction  levoFLOXacin and escitalopram    Additive QT interval prolongation may occur during coadministration of moderate-risk QT-prolonging agents, escitalopram and levoFLOXacin.

## 2023-05-14 NOTE — Progress Notes (Signed)
HPI: Heidi Thomas is a 77 y.o. female presenting with chief complain of status post Hartman's procedure for perforated diverticulitis.  She presented to [place] while on vacation to see her grandchild graduate college.  Currently she is tolerating diet with good stoma function.  She had a complicated postoperative course requiring IR drainage of pelvic abscess as well as PE.  She was started on Lovenox but there was some bleeding from her wound and a hemoglobin drop and she had an IVC filter placed.  The patient tells me she has restarted her anticoagulation on her own.  Her last colonoscopy was at least 10 years ago per the patient.    History reviewed. No pertinent past medical history.    Past Surgical History:   Procedure Laterality Date    CHOLECYSTECTOMY      COLONOSCOPY  2014    polyps    SIGMOID COLECTOMY  04/25/2023    sigmoid colectomy with end colostomy    TOTAL KNEE ARTHROPLASTY Bilateral        No family history on file.    Social History     Socioeconomic History    Marital status: Widowed     Spouse name: None    Number of children: None    Years of education: None    Highest education level: None   Tobacco Use    Smoking status: Former     Current packs/day: 0.00     Types: Cigarettes     Quit date: 05/13/2008     Years since quitting: 15.0    Smokeless tobacco: Former   Substance and Sexual Activity    Alcohol use: Not Currently    Drug use: Not Currently     Social Determinants of Health     Transportation Needs: No Transportation Needs (05/12/2023)    OASIS A1250: Transportation     Lack of Transportation (Medical): No     Lack of Transportation (Non-Medical): No     Patient Unable or Declines to Respond: No   Social Connections: Feeling Socially Integrated (05/12/2023)    OASIS D0700: Social Isolation     Frequency of experiencing loneliness or isolation: Never       Current Outpatient Medications   Medication Instructions    amLODIPine (NORVASC) 5 mg    amLODIPine (NORVASC) 10 mg, Oral, DAILY     amoxicillin-clavulanate (AUGMENTIN) 875-125 MG per tablet 1 tablet, Oral, 2 TIMES DAILY    atorvastatin (LIPITOR) 20 mg    calcium carb-cholecalciferol 600-10 MG-MCG TABS per tab 1 tablet, Oral, 2 TIMES DAILY    cyanocobalamin 500 mcg    escitalopram (LEXAPRO) 20 mg    famotidine (PEPCID) 40 mg    Latanoprost 0.005 % EMUL INSTILL 1 DROP IN BOTH EYES AT BEDTIME FOR GLAUCOMA    levoFLOXacin (LEVAQUIN) 500 mg, Oral, DAILY    methocarbamol (ROBAXIN) 750 mg    oxyBUTYnin (DITROPAN) 5 mg    oxyCODONE (ROXICODONE) 5 MG immediate release tablet TAKE 1-2 TABLETS BY MOUTH EVERY 4 HOURS AS NEEDED FOR SEVERE PAIN    rivaroxaban (XARELTO) 10 mg        Not on File    ROS: negative    Vitals:    05/14/23 1339   BP: 116/64   Pulse: 93   Resp: 17   Temp: 98 F (36.7 C)   SpO2: 98%       Physical Exam  Abdomen: Soft, nontender nondistended  Midline wound with gaps that are being packed with  wet-to-dry  No obvious residual infection    CT of the abdomen and pelvis from May 4 on admission shows perforation as well as a possible gastric mass  Discharge summary reviewed; perforated diverticulitis status post Hartman's procedure complicated by PE and bleeding after anticoagulation    Assessment / Plan    Status post Hartman's procedure for perforated diverticulitis  She has already restarted anticoagulation  Complete antibiotic course  CT abdomen and pelvis in 2 weeks to be sure there are no residual collections  Refer to GI for EGD and colonoscopy in 2 to 3 months (possible gastric mass noted on original CT)  Referral to heme-onc for anticoagulation management for history of PE as well as PE off anticoagulation perioperatively  Follow-up in the office in 3 weeks  Home nursing for wound care, colostomy care, physical therapy    The diagnoses and plan were discussed with the patient. All questions answered.  Plan of care agreed to by all concerned.

## 2023-05-14 NOTE — Home Health (Signed)
Skilled services/Home bound verification:     Skilled Reason for admission/summary of clinical condition:  S/p exploratory laparotomy, sigmoid colostomy, she has three wounds along inciison line that are open . She is living with one son and has another son that lives close that is taking care of wounds and ostomy. Patient has HX of HTN  .  This patient is homebound for the following reasons Requires considerable and taxing effort to leave the home , Requires the assistance of 1 or more persons to leave the home  and Only leaves the home for medical reasons or religious services and are infrequent and of short duration for other reasons .    Caregiver: relative.  Caregiver assists with meals  woudn care , stoma care .    Medications reconciled and all medications are available in the home this visit.      The following education was provided regarding medications: all meds reviewed and she is aware of what she takes and how to take them   Medications  are effective at this time.      High risk medication teaching regarding anticoagulants, antiplatelets, antibiotics, antipsychotics, hypoglycemic agents, or opioid/narcotics performed (specify): Instructed patient/caregiver to notify SN/PT of any adverse effects of antibiotic medications including rash, dizziness, nausea, diarrhea, or yeast infections.  Instructed patient/caregiver to notify SN/PT of signs and symptoms of anticoagulant adverse effects including bleeding gums, blood in urine or stool, easily bruising.  Instructed on importance of fall prevention due to risk for bleeding.    Instructed patient/caregiver to take exact amount of narcotics prescribed, signs and symptoms of oversedation, notify SN/PT if oversedated.  May cause constipation, notify SN/PT if no BM x 3 days.     Nance Pear, MD notified of any discrepancies/look a like medications/medication interactions major  levoFLOXacin and escitalopram    Additive QT interval prolongation may occur  during coadministration of moderate-risk QT-prolonging agents, escitalopram and levoFLOXacin.    Home health supplies by type and quantity ordered/delivered this visit include: ostomy care and wound care     Patient education provided this visit to include: SN educated on wound care , ostomy care and cutting wafer close to stoma , s/s of infeciton , when to call NS .      Patient level of understanding of education provided: patient verablized understanding     Sharps Education Provided: NA  Patient response to procedure performed:  patient stated that she was ok     Home exercise program/Homework provided: deep breathing     Functional Mobility:  Bed Mobility (rolling/scooting): Minimal /Moderate Assistance (requires assistance with less than 50% of the effort)  Transfers (supine to chair and return to supine): Minimal /Moderate Assistance (requires assistance with less than 50% of the effort).  Patient uses device: yes  Gait:  Ambulates at the modified independent level using a walker  Safety concerns with mobility include: unsteady gait, impaired balance , fatigues easily , pain interferes with activity and poor safety awareness  DME: walker     Pt/Caregiver instructed on plan of care and are agreeable to plan of care at this time.      Physician Nance Pear, MD notified of patient admission to home health and plan of care including anticipated frequency of SN and treatments/interventions/modalities of education.    Discharge planning discussed with patient and caregiver.  Discharge planning as follows: when goals met and education is complete.  Pt/Caregiver did verbalize understanding of discharge planning.  Next MD appointment 05/13/33 (date) with  MD/NP/PA.  Patient/caregiver encouraged/instructed to keep appointment as lack of follow through with physician appointment could result in discontinuation of home care services for non-compliance.

## 2023-05-14 NOTE — Case Communication (Signed)
Patient admitted for ostomy care and wound care . Son is able to preform wound cae

## 2023-05-15 ENCOUNTER — Encounter: Admit: 2023-05-15 | Discharge: 2023-05-15 | Payer: MEDICARE | Primary: Primary Care

## 2023-05-15 NOTE — Home Health (Signed)
Skilled reason for visit: Patient was recently hospitalized for Colostomy, requiring observation by a SN for s/s of decomposition or adverse effects resulting from newly prescribed medications.  Skilled observation needed to determine if new medication regimen prescribed requires modifications or other therapeutic interventions considered until pt's clinical condition or treatment has stabilized.  Caregiver involvement:  Patient's caregiver is her son. Caregiver assists patient with bathing, dressing, walking, bathroom, meal prep and setup, medication management, grocery shopping, household chores, transportation to MD appointment and home exercise program.  Medications reconciled and all medications are available in the home this visit.  The following education was provided regarding medications, medication interactions, and look alike modifications.  oxyCODONE (ROXICODONE) 5 MG immediate release tablet         Contact Agency or MD with questions.  Medications are effective at this time.  Patient states understanding.    Patient education provided this visit:  .X.Marland KitchenMarland KitchenMarland Kitchen Disease Management: Ostomy, increased protein diet teaching, pain management, s/s of infection.  See interventions for further teaching  Patient level of understanding of education provided: Pt verbalized understanding of all education and repeated back teaching   Skilled Care Performed this visit: Disease process teaching, medication teaching, physical assessment and monitoring  Patient response to procedure performed:  Pt verbalized satisfaction with wound care  Sharps Education Provided: NA  Goals/teaching progressing. Patient's goal is to regainn her strength. Progressing toward goals. Patient has remained free from falls, free from infection; no safety concerns at this time and is ambulating independently.  SN to complete education of patient and patient to follow up with any further questions or concerns with Dr Fonnie Jarvis  Home exercise program:   Fall risk prevention  Continued need for the following skills: Nursing   Patient and/or caregiver notified and agree to changes in the Plan of Care: No changes  The following discharge planning was discussed with the pt/caregiver:  SN to continue education of patient and discharge patient when teaching and goals are met.  Large amount of stool in ostomy bag today. Patient was waiting for her son to come home to empty it. Teaching on how to empty bag and importance of knowing how to do it herself, so the bag does not get too full. Patient is reluctant to empty the bag herself. Reinforced importance of her knowing how to do it.

## 2023-05-19 ENCOUNTER — Encounter

## 2023-05-19 ENCOUNTER — Encounter: Admit: 2023-05-19 | Discharge: 2023-05-19 | Payer: MEDICARE | Primary: Primary Care

## 2023-05-19 NOTE — Home Health (Signed)
Skilled reason for visit: wound care      Caregiver involvement: Caregiver assists patient with bathing, dressing, bathroom, meal prep and setup, medication management, grocery shopping, household chores, transportation to MD appointment and home exercise program..    .    Medications reviewed and all medications are available in the home this visit.    The following education was provided regarding medications:  Signs of an allergic reaction, like rash; hives; itching; red, swollen, blistered, or peeling skin with or without fever; wheezing; tightness in the chest or throat; trouble breathing, swallowing, or talking; unusual hoarseness; or swelling of the mouth, face, lips, tongue, or throat.       .    MD notified of any discrepancies/look a-like medications/medication interactions: n/a  Medications are effective at this time.      Home health supplies by type and quantity ordered/delivered this visit include: n/a    Patient education provided this visit: medication teaching   safety and fall prevention education   nutrition education   skin care and assessment   pain management              Sharps education provided: n/a    Patient level of understanding of education provided: Patient/Caregiver response to education: Partial teach back and Verbalized understanding       Patient response to procedure performed:  tolerated well     Agency Progress toward goals: progressive     Patient's Progress towards personal goals: progressive     Home exercise program: If you notice the skin on and around the wound is warm to the touch, it may be an indication that your body is fighting an infection. The presence of pus or unusual discharge from the wound is a clear sign of infection. Pus is typically thick, yellow, or green in color and may have an unpleasant odor and notify physician      Continued need for the following skills: Nursing.    Plan for next visit: wound care     Patient and/or caregiver notified and agrees to  changes in the Plan of Care: N/A.     The following discharge planning was discussed with the pt/caregiver:  pt will be discharged when  able to independently manage disease process,  medication regimen, all nursing goals have been met, and patient no longer requires skilled care.

## 2023-05-21 ENCOUNTER — Encounter

## 2023-05-22 ENCOUNTER — Encounter: Admit: 2023-05-22 | Discharge: 2023-05-22 | Payer: MEDICARE | Primary: Primary Care

## 2023-05-22 NOTE — Home Health (Signed)
Skilled reason for visit: wound care      Caregiver involvement: Caregiver assists patient with bathing, dressing, bathroom, meal prep and setup, medication management, grocery shopping, household chores, transportation to MD appointment and home exercise program..    .    Medications reviewed and all medications are available in the home this visit.    The following education was provided regarding medications:  This drug works better if you take it on an empty stomach. You may take this drug with food if it causes an upset stomach. Some foods like eggs, whole grain breads, cereal, dairy products, coffee, and tea may make this drug not work as well. If this drug causes an upset stomach, talk with your doctor about the best way to take this drug with food. If antacids are used, they may need to be taken at some other time than this drug. Talk with your doctor or pharmacist      .    MD notified of any discrepancies/look a-like medications/medication interactions: n/a  Medications are effective at this time.      Home health supplies by type and quantity ordered/delivered this visit include: yes    Patient education provided this visit: medication teaching   safety and fall prevention education   nutrition education   skin care and assessment   pain management        Sharps education provided: n/a    Patient level of understanding of education provided: Patient/Caregiver response to education: Verbalized understanding       Patient response to procedure performed:  tolerated well     Agency Progress toward goals: progressive     Patient's Progress towards personal goals: progressive     Home exercise program: continue ambulation with walker for safe ambulation to prevent falls        Continued need for the following skills: Nursing.    Plan for next visit: wound care     Patient and/or caregiver notified and agrees to changes in the Plan of Care: N/A.     The following discharge planning was discussed with the  pt/caregiver:  pt will be discharged when  able to independently manage disease process,  medication regimen, all nursing goals have been met, and patient no longer requires skilled care.

## 2023-05-25 ENCOUNTER — Encounter: Payer: PRIVATE HEALTH INSURANCE | Primary: Primary Care

## 2023-05-25 ENCOUNTER — Encounter: Admit: 2023-05-25 | Discharge: 2023-05-25 | Payer: PRIVATE HEALTH INSURANCE | Primary: Primary Care
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
## 2023-05-27 NOTE — Unmapped (Signed)
I certify that this patient is confined to his/her home and needs intermittent skilled nursing care, physical therapy and/or speech therapy or continues to need occupational therapy.  The patient is under my care, and I have authorized services on this plan of care and will periodically review the plan.  The patient had a face-to face encounter with an allowed provider type on 05/14/23 and the encounter was related to the primary reason for home health care.

## 2023-05-27 NOTE — Unmapped (Signed)
Code Status:  Full Code

## 2023-05-28 ENCOUNTER — Encounter: Admit: 2023-05-28 | Discharge: 2023-05-28 | Payer: MEDICARE | Primary: Primary Care

## 2023-05-28 NOTE — Home Health (Signed)
SUBJECTIVE:  Patient reports having colostomy bag issues this AM.     DATE OF SURGERY: N/A.    PAIN: see pain assessment    OBJECTIVE: see interventions    NEXT MD APPT: TBD.    CAREGIVER ASSISTANCE NEEDED FOR: Caregiver needed for ADLs, IADLs, gait, transfers, stair navigation and transportation to/from medical appointments.     ASSESSMENT AND PROGRESS TOWARD GOALS:  Patient demonstrated a good result to therapy this date as evidenced by progression of gait x 150' indoors rollator SBA for safety with noted symmetrical gait pattern.  Therapist established HEP with education on importance of frequency for optimal strengthening results.  Patient able to return demo with no reports of pain post, however increased fatigue.  Patient progressing transfers with return demo SPTs SBA for safety.     PATIENT RESPONSE TO TREATMENT:  Increased fatigue post session.      PATIENT LEVEL OF UNDERSTANDING OF EDUCATION: Return demo HEP & SPTs.     CONTINUED NEED FOR THE FOLLOWING SKILLS: HH PT is medically necessary to address pain, decreased ROM, decreased strength, increased swelling, impaired bed mobility, decreased independence with functional transfers, impaired gait, impaired stair negotiation, and impaired balance in order to improve functional independence, quality of life, return to PLOF, reduce the risk for falls, and reduce pain.      PLAN: Continued gait, continued strengthening, various transfers.     DISCHARGE PLANNING DISCUSSED: Patient to continue HHPT 2w2, 1w1 at this time.

## 2023-05-28 NOTE — Home Health (Signed)
Skilled reason for visit: wound care      Caregiver involvement:  assist with ADL's, medications, meal preparation, transporation to MD appointments.    .    Medications reviewed and all medications are available in the home this visit.    The following education was provided regarding medications:  .Long-acting products:   Swallow whole. Do not chew, break, or crush.  Lozenge:   Suck oral lozenge. Do not chew, break, or crush it. Do not swallow it whole.  Chewable tablets:   Chew well before swallowing.  Liquid:   Measure liquid doses carefully. Use the measuring device that comes with this drug. If there is none, ask the pharmacist for a device to measure this drug.    .    MD notified of any discrepancies/look a-like medications/medication interactions: n/a  Medications are effective at this time.      Home health supplies by type and quantity ordered/delivered this visit include: n/a    Patient education provided this visit: medication teaching   safety and fall prevention education   nutrition education   skin care and assessment   pain management        Sharps education provided: n/a    Patient level of understanding of education provided: Patient/Caregiver response to education: Verbalized understanding       Patient response to procedure performed:  tolerated well     Agency Progress toward goals: progressive     Patient's Progress towards personal goals: progressive     Home exercise program: If you notice the skin on and around the wound is warm to the touch, it may be an indication that your body is fighting an infection. The presence of pus or unusual discharge from the wound is a clear sign of infection. Pus is typically thick, yellow, or green in color and may have an unpleasant odor and notify physician      Continued need for the following skills: Nursing and Physical Therapy.    Plan for next visit: wound care     Patient and/or caregiver notified and agrees to changes in the Plan of Care: N/A.      The following discharge planning was discussed with the pt/caregiver:  pt will be discharged when  able to independently manage disease process,  medication regimen, all nursing goals have been met, and patient no longer requires skilled care.

## 2023-05-30 ENCOUNTER — Encounter: Payer: PRIVATE HEALTH INSURANCE | Primary: Primary Care

## 2023-06-01 ENCOUNTER — Encounter: Payer: PRIVATE HEALTH INSURANCE | Primary: Primary Care

## 2023-06-03 ENCOUNTER — Encounter: Admit: 2023-06-03 | Discharge: 2023-06-03 | Payer: MEDICARE | Primary: Primary Care

## 2023-06-03 NOTE — Home Health (Signed)
SUBJECTIVE:  Patient reports having a great morning.     DATE OF SURGERY: N/A.    PAIN: see pain assessment    OBJECTIVE: see interventions    NEXT MD APPT: TBD.    CAREGIVER ASSISTANCE NEEDED FOR: Caregiver needed for ADLs, IADLs, gait, transfers, stair navigation and transportation to/from medical appointments.     ASSESSMENT AND PROGRESS TOWARD GOALS:  Patient demonstrated a good result to therapy this date as evidenced by continued gait x 150' indoors no AD SBA for safety with noted symmetrical gait pattern.  Increased time secondary to slow cadence.  Patient able to teach back and demo HEP at this time with increased time secondary to needed rest breaks due to fatiuge.  Patient reports no increase to pain post.     PATIENT RESPONSE TO TREATMENT:  Increased fatigue post session, however no pain.       PATIENT LEVEL OF UNDERSTANDING OF EDUCATION: Teach back and demo HEP.      CONTINUED NEED FOR THE FOLLOWING SKILLS: HH PT is medically necessary to address pain, decreased ROM, decreased strength, increased swelling, impaired bed mobility, decreased independence with functional transfers, impaired gait, impaired stair negotiation, and impaired balance in order to improve functional independence, quality of life, return to PLOF, reduce the risk for falls, and reduce pain.      PLAN: Continued PT POC.     DISCHARGE PLANNING DISCUSSED: Patient to continue HHPT 2w2, 1w1 at this time.

## 2023-06-04 ENCOUNTER — Encounter: Admit: 2023-06-04 | Discharge: 2023-06-04 | Payer: MEDICARE | Primary: Primary Care

## 2023-06-04 ENCOUNTER — Ambulatory Visit: Payer: MEDICARE | Primary: Primary Care

## 2023-06-04 ENCOUNTER — Encounter: Payer: PRIVATE HEALTH INSURANCE | Primary: Primary Care

## 2023-06-04 DIAGNOSIS — K5720 Diverticulitis of large intestine with perforation and abscess without bleeding: Secondary | ICD-10-CM

## 2023-06-04 MED ORDER — IOPAMIDOL 61 % IV SOLN
61 | Freq: Once | INTRAVENOUS | Status: AC | PRN
Start: 2023-06-04 — End: 2023-06-04
  Administered 2023-06-04: 19:00:00 100 mL via INTRAVENOUS

## 2023-06-04 MED FILL — ISOVUE-300 61 % IV SOLN: 61 % | INTRAVENOUS | Qty: 100

## 2023-06-04 NOTE — Home Health (Signed)
Skilled reason for visit: wound care     Caregiver involvement:  assist with ADL's, medications, meal preparation, transporation to MD appointments.     .    Medications reviewed and all medications are available in the home this visit.    The following education was provided regarding medications:  .Long-acting products:   Swallow whole. Do not chew, break, or crush.  Lozenge:   Suck oral lozenge. Do not chew, break, or crush it. Do not swallow it whole.  Chewable tablets:   Chew well before swallowing.  Liquid:   Measure liquid doses carefully. Use the measuring device that comes with this drug. If there is none, ask the pharmacist for a device to measure this drug.    .    MD notified of any discrepancies/look a-like medications/medication interactions: n/a  Medications are effective at this time.      Home health supplies by type and quantity ordered/delivered this visit include: n/a    Patient education provided this visit: medication teaching   safety and fall prevention education   nutrition education   skin care and assessment   pain management        Sharps education provided:n/a          Patient level of understanding of education provided: Patient/Caregiver response to education: Verbalized understanding       Patient response to procedure performed:  tolerated well     Agency Progress toward goals: progressive     Patient's Progress towards personal goals: progressive     Home exercise program: If you notice the skin on and around the wound is warm to the touch, it may be an indication that your body is fighting an infection. The presence of pus or unusual discharge from the wound is a clear sign of infection. Pus is typically thick, yellow, or green in color and may have an unpleasant odor and notify physician      Continued need for the following skills: Nursing. physical therapy     Plan for next visit: wound care

## 2023-06-05 ENCOUNTER — Encounter: Admit: 2023-06-05 | Discharge: 2023-06-05 | Payer: PRIVATE HEALTH INSURANCE | Primary: Primary Care

## 2023-06-05 NOTE — Home Health (Incomplete)
SUBJECTIVE: Patient reports she has been up all morning  CAREGIVER INVOLVEMENT/ASSISTANCE NEEDED FOR: Patient has family that offer assist with any aspect of care as needed  HOME HEALTH SUPPLIES BY TYPE AND QUANTITY ORDERED/DELIVERED THIS VISIT INCLUDE: None  OBJECTIVE:  See interventions.  PATIENT RESPONSE TO TREATMENT:    PATIENT LEVEL OF UNDERSTANDING OF EDUCATION PROVIDED: ***  ASSESSMENT OF PROGRESS TOWARD GOALS: ***  CONTINUED NEE D FOR THE FOLLOWING SKILLS: Patient will be seen  x week for Physical Therapy to continue to work toward goals within POC and HHPT is medically necessary to address  dx and clinical findings: decreased ROM, decreased strength, impaired gait, decreased ability w stair negotiation, increased swelling, decreased bed mobility, decreased transfer status, decreased endurance, decreased balance and decreased safety, increased pain in order to  improve functional mobility/quality of life, decrease burden of care, reduce risk for re-hospitalization, work towards patient's personal goals of return to PLOF w decrease risk for falls.  PLAN FOR NEXT VISIT: ***  THE FOLLOWING DISCHARGE PLANNING WAS DISCUSSED WITH THE PATIENT/CAREGIVER: This is visit number   out of    planned visits.  NEXT MD APPT:

## 2023-06-06 ENCOUNTER — Encounter: Payer: PRIVATE HEALTH INSURANCE | Primary: Primary Care

## 2023-06-08 ENCOUNTER — Encounter: Admit: 2023-06-08 | Discharge: 2023-06-08 | Payer: PRIVATE HEALTH INSURANCE | Primary: Primary Care

## 2023-06-08 ENCOUNTER — Ambulatory Visit: Admit: 2023-06-08 | Discharge: 2023-06-08 | Attending: Colon & Rectal Surgery | Primary: Primary Care

## 2023-06-08 DIAGNOSIS — K5720 Diverticulitis of large intestine with perforation and abscess without bleeding: Secondary | ICD-10-CM

## 2023-06-08 NOTE — Progress Notes (Signed)
Subjective: Tolerating diet with good stoma function.  Having some slight leakage issues.    Past medical history and ROS were reviewed and unchanged.     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      The diagnoses and plan were discussed with patient.  All questions answered.  Plan of care agreed to by all concerned.

## 2023-06-10 ENCOUNTER — Encounter: Admit: 2023-06-10 | Discharge: 2023-06-10 | Payer: MEDICARE | Primary: Primary Care

## 2023-06-11 ENCOUNTER — Encounter: Payer: PRIVATE HEALTH INSURANCE | Primary: Primary Care

## 2023-06-12 ENCOUNTER — Encounter: Admit: 2023-06-12 | Discharge: 2023-06-12 | Payer: MEDICARE | Primary: Primary Care

## 2023-06-12 ENCOUNTER — Encounter: Admit: 2023-06-12 | Discharge: 2023-06-12 | Payer: PRIVATE HEALTH INSURANCE | Primary: Primary Care

## 2023-06-12 ENCOUNTER — Encounter: Payer: PRIVATE HEALTH INSURANCE | Primary: Primary Care

## 2023-06-12 NOTE — Case Communication (Signed)
Patient's son reports he called the office and cx physical therapy visit for Monday due to an MD appointment-Missed visit for Physical therapy. Arrived at prescheduled time-no answer at the door.  Spoke with patient's son by phone. Unable to reschedule this appt with patient and offered Saturday appointment however she declined.  Patient is scheduled for Friday 6/21 for physical therapy visit.

## 2023-06-12 NOTE — Home Health (Signed)
Skilled reason for visit: wound care      Caregiver involvement:  assist with ADL's, medications, meal preparation, transporation to MD appointments.    .    Medications reviewed and all medications are available in the home this visit.    The following education was provided regarding medications:  This drug works better if you take it on an empty stomach. You may take this drug with food if it causes an upset stomach. Some foods like eggs, whole grain breads, cereal, dairy products, coffee, and tea may make this drug not work as well. If this drug causes an upset stomach, talk with your doctor about the best way to take this drug with food. If antacids are used, they may need to be taken at some other time than this drug. Talk with your doctor or pharmacist      .    MD notified of any discrepancies/look a-like medications/medication interactions: n/a  Medications are effective at this time.      Home health supplies by type and quantity ordered/delivered this visit include: n/a    Patient education provided this visit: medication teaching   safety and fall prevention education   nutrition education   skin care and assessment   pain management        Sharps education provided: n/a    Patient level of understanding of education provided: Patient/Caregiver response to education: Needs reinforcement       Patient response to procedure performed:  tolerated well helped pt witg changing colostomy bag     Agency Progress toward goals: progressive     Patient's Progress towards personal goals: progressive     Home exercise program: continue ambulation with walker for safe ambulation to prevent falls      Continued need for the following skills: Nursing physical therapy     Plan for next visit: wound care     Patient and/or caregiver notified and agrees to changes in the Plan of Care: N/A.     The following discharge planning was discussed with the pt/caregiver:  pt will be discharged when  able to independently manage  disease process,  medication regimen, all nursing goals have been met, and patient no longer requires skilled care.

## 2023-06-12 NOTE — Home Health (Signed)
SUBJECTIVE: Patient reports she was seen by her Surgeon and staples were removed and there were no med changes or sxs of infection. Patient feels she has "turned a corner."  CAREGIVER INVOLVEMENT/ASSISTANCE NEEDED FOR: Patient now has caregiver from the [place] to offer assist as needed and her son is able to offer any assist as well and lives with [employer]  HOME HEALTH SUPPLIES BY TYPE AND QUANTITY ORDERED/DELIVERED THIS VISIT INCLUDE: None  OBJECTIVE:  See interventions.  PATIENT RESPONSE TO TREATMENT:  Good response to treatment with no pain or "pulling sensation" reported  PATIENT LEVEL OF UNDERSTANDING OF EDUCATION PROVIDED: Goals met with good response from patient  ASSESSMENT OF PROGRESS TOWARD GOALS: Gait training without an AD (Patient declined cane or FWW) with good quality Mod I-extra time-cadence is normalized and patient is more confident in her movements-300' x 1.  Sit to stand from counter height kitchen chair with B UE to push up-Mod I.      Assessment and Summary of Care:  Patient's current functional status before discharge is as follows  Strength: Needs timed sit to stand test  ROM:  N/T  Bed Mobility: Independent  Transfers: Sit tostand-Mod I from kitchen chair and bed and she reports no diffiuclty with car transfers  Gait/WC mobility: 300' at best with no AD with good quality -Mod I-she reports no difficulty going downstairs with the elevator for her MD appt  Stairs: N/T  Special Tests: N/T  Recommendations: Patient  has progressed well towards goals and will speak with her MD whens he feels she is able to go to outpatient if she" needs it" Discussed walking within building due to very hot weather and now that she has a caregiver and she agreed and reports she will continue HEP  CONTINUED NEED FOR THE FOLLOWING SKILLS: Patient will be seen 2 x week for Physical Therapy to continue to work toward goals within POC and HHPT is medically necessary to address  dx and clinical findings: decreased ROM,  decreased strength, impaired gait, decreased ability w stair negotiation, increased swelling, decreased bed mobility, decreased transfer status, decreased endurance, decreased balance and decreased safety, increased pain in order to improve functional mobility/quality of life, decrease burden of care, reduce risk for re-hospitalization, work towards patient's personal goals of return to PLOF w decrease risk for falls.  PLAN FOR NEXT VISIT: Discharge assessment from Supervising Physical Therapist  THE FOLLOWING DISCHARGE PLANNING WAS DISCUSSED WITH THE PATIENT/CAREGIVER: This is visit number  5 out of   7 planned visits.  NEXT MD APPT:TBD

## 2023-06-13 ENCOUNTER — Encounter: Payer: PRIVATE HEALTH INSURANCE | Primary: Primary Care

## 2023-06-15 ENCOUNTER — Encounter: Payer: PRIVATE HEALTH INSURANCE | Primary: Primary Care

## 2023-06-15 NOTE — Case Communication (Signed)
Assessment and Summary of Care:  Patient's current functional status before discharge is as follows  Strength: Needs timed sit to stand test  ROM:  N/T  Bed Mobility: Independent  Transfers: Sit tostand-Mod I from kitchen chair and bed and she reports no diffiuclty with car transfers  Gait/WC mobility: 300' at best with no AD with good quality -Mod I-she reports no difficulty going downstairs with the elevator for her MD appt  Stairs: N /T  Special Tests: N/T  Recommendations: Patient  has progressed well towards goals and will speak with her MD whens he feels she is able to go to outpatient if she" needs it" Discussed walking within building due to very hot weather and now that she has a caregiver and she agreed and reports she will continue HEP

## 2023-06-15 NOTE — Telephone Encounter (Addendum)
Notified patient of CT results. Patient understands.    ----- Message from Nance Pear, MD sent at 06/11/2023  8:26 AM EDT -----  Tell her CT looks good.  ----- Message -----  From: Edi, [facility] Incoming Orders Results To [facility]  Sent: 06/09/2023  10:45 PM EDT  To: Nance Pear, MD

## 2023-06-16 ENCOUNTER — Encounter: Admit: 2023-06-16 | Discharge: 2023-06-16 | Payer: MEDICARE | Primary: Primary Care

## 2023-06-16 ENCOUNTER — Encounter: Payer: PRIVATE HEALTH INSURANCE | Primary: Primary Care

## 2023-06-16 NOTE — Home Health (Signed)
SKILLED REASON FOR ADMISSION: Pt is an 77 y.o. female referred s/p exploratory laparotomy sigmoid colon and PE     SUMMARY: Pt lives in an apt complex on the 6th floor with her son who is active and supportive in her care. Complex has a elevator and is secured for entry. A private caregiver was hired to assist pt 2x a week with some tasks such as cleaning and other IADLs. DME includes: rollator, hospital bed and [facility]     PMHx: osteoporosis, HTN, GERD, diverticulitis, depression and patient reports B TKR     PLOF: INDEPENDENT with ADLs and IADLs  Medications: Pt reports no changes to medications since last reviewed and educated to continue as directed per MD.      SUBJECTIVE: I think I am doing pretty good, thanks.     OBJECTIVE:  BATHING: Pt SUPERVISION with bathing in tub shower while standing. Therapist recommends grab bars and shower chair.   TOILETING: Pt INDEPENDENT for toileting on regular height toilet.   UB DRESSING: Pt INDEPENDENT for upper body dressing to don/doff shirts  LB DRESSING: Pt INDEPENDENT for lower body dressing to don/doff socks, shoes and pants  GROOMING: Pt INDEPENDENT for grooming for hair care, oral care and washing hands/face  FEEDING: Pt INDEPENDENT for self feeding    BUE MMT: 4-/5  BUE AROM: WFL  BUE COORDINATION: Pt GOOD hand coordination WFL shown through serial opposition test   BUE FINE MOTOR STRENGTH: Pt presents with GOOD hand strength for grasping objects for ADL/IADL tasks and opening containers. Pt is RIGHT handed.    BALANCE: Pt with GOOD static and dynamic sitting balance/tolerance. Pt with GOOD static and dynamic standing balance/tolerance.  AMBULATION: Pt SUPERVISION for ambulation without AD for household distances. Pt uses rollator for community outings.   BED MOBILITY: Pt INDEPENDENT for all bed mobility  COUCH: Pt SUPERVISION for sit to stand transfers without AD  TOILET: Pt SUPERVISION for toilet transfers without AD. Therapist recommends grab bar for safety.   SHOWER:  Pt SUPERVISION for tub transfer. Therapist recommends grab bars and shower chair.      Barthel Index: 90  IADL: Pt can perform some IADLs such as cooking and laundry. Her son and private caregiver assist with other IADLs      DME ORDERED/RECOMMENDED: See notes in OBJECTIVE section     PATIENT RESPONSE TO TREATMENT: Pt tolerated treatment well with no reports of increased pain/discomfort throughout the session.      PATIENT EDUCATION PROVIDED THIS VISIT: Pt and/or caregiver trained/educated in the role of occupational therapy; areas of occupation; occupation-based activity demands; performance patterns and skills; context and environmental factors; safety; balance; fall prevention; activity modifications; compensatory strategies; use of adaptive device(s) and/or DME; proper posture and body mechanics; progression of treatment plan; visit frequency and duration; discharge planning; and contact procedures for routine questions and emergency situations. This therapist reviewed the patient's needs to ensure no additional resources are needed at this time and confirm the current resources are sufficient and appropriate. All training and education is for the purpose of increasing safety and awareness of function in order to increase the patient's ability to perform ADLs, IADLs and access the community with a decreasing degree of difficulty. This will improve the patient's quality of life by increasing his/her physical, psychological and social functioning while minimizing the necessity of future skilled medical services.      PATIENT LEVEL OF UNDERSTANDING OF EDUCATION PROVIDED: Pt demonstrated good understanding of role of OT, falls prevention  and safety education, energy conservation techniques such as keeping objects at appropriate height to minimize lifting and bending, and discharge.      REHAB POTENTIAL: Ms. Dirk presents near baseline level of independence with ADLs and functional mobility as evidenced by the  Barthel Index score of 90/100. Pt can perform some IADLs such as cooking and laundry. Her son and private caregiver assist with other IADLs. Pt with good BUE strength noted by 4-/5 allowing pt to use BUE strength to aid in functional transfers and perform tasks. Pt in agreeance with occupational therapy evaluation only.      HOME EXERCISE PROGRAM: N/A      Skilled Care Provided: Pt completed full occupational therapy assessment to include strength, balance, coordination, ADL education/training, functional mobility, energy conservation and home safety.      ASSESSMENT: Pt presents with ability to complete daily ADL routines with independence to supervision. Pt was educated on energy conservation, falls prevention and safety in the home. She can complete household mobility and functional transfers needed for ADLs with supervision. Pt with good BUE strength noted by 4-/5 allowing pt to use BUE strength to aid in functional transfers and perform tasks. Pt verbalized understanding she may request a reevaluation of OT services if status changes during this Home Health episode. MD and Home Health team notified.      PLAN: 1w1 with continued HHSN

## 2023-06-16 NOTE — Home Health (Signed)
S: Patient reports 50% overall subjective improvement rating. She is very thankful for the care she is getting from everyone.   O: PAIN: see pain tab   WOUND: see RN notes for ostomy   ROM: no impairments noted   STRENGTH:Patient demonstrates decreased muscle strength as follows:  R hip flex 4-/5  R hip abd 4/5  R hip add 4/5  R knee flex 4/5  R knee ext 4/5  R ankle DF 4/5  L hip flex 4-/5  L hip abd 4/5  L hip add 4/5  L knee flex 4/5  L knee ext 4/5  L ankle DF 4/5  FTSTS goal met   BED MOBILITY: independent   EQUIPMENT:4WW  TRANSFERS: MI  GAIT: >300 ft with 8JX, MI with good reciprocal gait pattern.   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 This patient has currently met all goals and has been discharged to a HEP at this time. The following goals have been met: TUG 24 seconds, FTSTS 24 seconds, Tinetti 24/28 and patient is ambulating >300 ft with 4WW MI. Patient verbalized understanding of all discharge instructions and is in agreement with discharge this visit.      P:DC    Discharge medication list reconciled with patient and caregiver. Questions regarding medications answered and patient/caregiver advised to refer to MD for any medication questions after discharge.  2. Patient to continue use of the following assistive device for maximum safety: 4WW  3. Today's treatment included: review of therapeutic exercise program, reassessment of mobility, transfers, balance and gait.   4. Patient and caregiver demonstrate understanding of DC instructions and repeat verbalization. Patient and caregiver given written copy of instructions.  5. Patient and caregiver given notification of discharge and in agreement with DC this date.  6. MD notified of discharge.

## 2023-06-17 ENCOUNTER — Encounter: Admit: 2023-06-17 | Discharge: 2023-06-17 | Payer: MEDICARE | Primary: Primary Care

## 2023-06-17 NOTE — Home Health (Signed)
Skilled reason for visit: wound care      Caregiver involvement:  assist with ADL's, medications, meal preparation, transporation to MD appointments.    .    Medications reviewed and all medications are available in the home this visit.    The following education was provided regarding medications:  Signs of an allergic reaction, like rash; hives; itching; red, swollen, blistered, or peeling skin with or without fever; wheezing; tightness in the chest or throat; trouble breathing, swallowing, or talking; unusual hoarseness; or swelling of the mouth, face, lips, tongue, or throat.     .    MD notified of any discrepancies/look a-like medications/medication interactions: n/a  Medications are effective at this time.      Home health supplies by type and quantity ordered/delivered this visit include: n/a    Patient education provided this visit: medication teaching   safety and fall prevention education   nutrition education   skin care and assessment   pain management        Sharps education provided: n/a    Patient level of understanding of education provided: Patient/Caregiver response to education: Verbalized understanding         Patient response to procedure performed:  tolerated well     Agency Progress toward goals: progressive     Patient's Progress towards personal goals: progressive     Home exercise program: If you notice the skin on and around the wound is warm to the touch, it may be an indication that your body is fighting an infection. The presence of pus or unusual discharge from the wound is a clear sign of infection. Pus is typically thick, yellow, or green in color and may have an unpleasant odor and notify physician      Continued need for the following skills: Nursing. PT     Plan for next visit: wound care     Patient and/or caregiver notified and agrees to changes in the Plan of Care: N/A.     The following discharge planning was discussed with the pt/caregiver:  pt will be discharged when  able  to independently manage disease process,  medication regimen, all nursing goals have

## 2023-06-17 NOTE — Telephone Encounter (Signed)
Called patient to notify her of [facility] in order to see Dr. Acquanetta Belling onc. [facility] and [facility] to Dr. Fidela Juneau office. Left message with this information.

## 2023-06-18 NOTE — Case Communication (Signed)
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 This patient has currently met all goals and has been discharged to a HEP at this time. The following goals have been met: TUG 24 seconds, FTSTS 24 seconds, Tinetti 24/28 and patient is ambulating >300 ft with 4WW MI. Patient verbalized understanding of all discharge instructions and is in agreement with discharge this visit.

## 2023-06-19 ENCOUNTER — Encounter: Admit: 2023-06-19 | Discharge: 2023-06-19 | Payer: PRIVATE HEALTH INSURANCE | Primary: Primary Care

## 2023-06-19 NOTE — Home Health (Signed)
Skilled reason for visit: wound care      Caregiver involvement:  assist with ADL's, medications, meal preparation, transporation to MD appointments.    .    Medications reviewed and all medications are available in the home this visit.    The following education was provided regarding medications:  This drug works better if you take it on an empty stomach. You may take this drug with food if it causes an upset stomach. Some foods like eggs, whole grain breads, cereal, dairy products, coffee, and tea may make this drug not work as well. If this drug causes an upset stomach, talk with your doctor about the best way to take this drug with food. If antacids are used, they may need to be taken at some other time than this drug. Talk with your doctor or pharmacist      .    MD notified of any discrepancies/look a-like medications/medication interactions: n/a  Medications are effective at this time.      Home health supplies by type and quantity ordered/delivered this visit include: n/a    Patient education provided this visit: medication teaching   safety and fall prevention education   nutrition education   skin care and assessment   pain management        Sharps education provided: n/a    Patient level of understanding of education provided: Patient/Caregiver response to education: Verbalized understanding         Patient response to procedure performed:  tolerated well     Agency Progress toward goals: progressive     Patient's Progress towards personal goals: progressive     Home exercise program: continue ambulation with walker for safe ambulation to prevent falls      Continued need for the following skills: Nursing.PT ,OT     Plan for next visit: wound care     Patient and/or caregiver notified and agrees to changes in the Plan of Care: N/A.     The following discharge planning was discussed with the pt/caregiver:  pt will be discharged when  able to independently manage disease process,  medication regimen, all  nursing goals have

## 2023-06-23 ENCOUNTER — Encounter: Admit: 2023-06-23 | Discharge: 2023-06-24 | Payer: MEDICARE | Primary: Primary Care

## 2023-06-23 NOTE — Home Health (Signed)
Skilled reason for visit: wound care     Caregiver involvement:  assist with ADL's, medications, meal preparation, transporation to MD appointments.    Medications reviewed and all medications are available in the home this visit.    The following education was provided regarding medications:  Signs of an allergic reaction, like rash; hives; itching; red, swollen, blistered, or peeling skin with or without fever; wheezing; tightness in the chest or throat; trouble breathing, swallowing, or talking; unusual hoarseness; or swelling of the mouth, face, lips, tongue, or throat.       MD notified of any discrepancies/look a-like medications/medication interactions: n/a  Medications are effective at this time.      Home health supplies by type and quantity ordered/delivered this visit include: n/a    Patient education provided this visit: medication teaching   safety and fall prevention education   nutrition education   skin care and assessment   pain management    Sharps education provided: n/a      Patient level of understanding of education provided: Patient/Caregiver response to education: Verbalized understanding       Patient response to procedure performed:  tolerated well     Agency Progress toward goals: progressive     Patient's Progress towards personal goals: progressive     Home exercise program: continue ambulation with walker for safe ambulation to prevent falls      Continued need for the following skills: Nursing PT OT    Plan for next visit: wound care    Patient and/or caregiver notified and agrees to changes in the Plan of Care: N/A.     The following discharge planning was discussed with the pt/caregiver:  pt will be discharged when  able to independently manage disease process,  medication regimen, all nursing goals have been met, and patient no longer requires skilled care.

## 2023-06-26 ENCOUNTER — Encounter: Admit: 2023-06-26 | Discharge: 2023-06-26 | Payer: PRIVATE HEALTH INSURANCE | Primary: Primary Care

## 2023-06-26 NOTE — Home Health (Signed)
Skilled reason for visit: wound care     Caregiver involvement:  assist with ADL's, medications, meal preparation, transporation to MD appointments.    Medications reviewed and all medications are available in the home this visit.    The following education was provided regarding medications:  Signs of an allergic reaction, like rash; hives; itching; red, swollen, blistered, or peeling skin with or without fever; wheezing; tightness in the chest or throat; trouble breathing, swallowing, or talking; unusual hoarseness; or swelling of the mouth, face, lips, tongue, or throat.       MD notified of any discrepancies/look a-like medications/medication interactions: n/a  Medications are effective at this time.      Home health supplies by type and quantity ordered/delivered this visit include: n/a    Patient education provided this visit: medication teaching   safety and fall prevention education   nutrition education   skin care and assessment   pain management    Sharps education provided:n/a      Patient level of understanding of education provided: Patient/Caregiver response to education: Verbalized understanding       Patient response to procedure performed:  tolerated well     Agency Progress toward goals: progressive     Patient's Progress towards personal goals: progressive     Home exercise program: pt will continue to monitor for s/s of infection of wound      Continued need for the following skills: Nursing    Plan for next visit: wound care    Patient and/or caregiver notified and agrees to changes in the Plan of Care: N/A.     The following discharge planning was discussed with the pt/caregiver:  pt will be discharged when  able to independently manage disease process,  medication regimen, all nursing goals have been met, and patient no longer requires skilled care.

## 2023-06-29 ENCOUNTER — Encounter: Payer: PRIVATE HEALTH INSURANCE | Attending: Colon & Rectal Surgery | Primary: Primary Care

## 2023-06-30 ENCOUNTER — Encounter: Admit: 2023-06-30 | Discharge: 2023-06-30 | Payer: MEDICARE | Primary: Primary Care

## 2023-06-30 NOTE — Home Health (Signed)
Skilled reason for visit: wound care     Caregiver involvement:  assist with ADL's, medications, meal preparation, transporation to MD appointments.    Medications reviewed and all medications are available in the home this visit.    The following education was provided regarding medications:  Signs of an allergic reaction, like rash; hives; itching; red, swollen, blistered, or peeling skin with or without fever; wheezing; tightness in the chest or throat; trouble breathing, swallowing, or talking; unusual hoarseness; or swelling of the mouth, face, lips, tongue, or throat.       MD notified of any discrepancies/look a-like medications/medication interactions: n/a  Medications are effective at this time.      Home health supplies by type and quantity ordered/delivered this visit include: n/a    Patient education provided this visit: medication teaching   safety and fall prevention education   nutrition education   skin care and assessment   pain management    Sharps education provided: n/a      Patient level of understanding of education provided: Patient/Caregiver response to education: Verbalized understanding       Patient response to procedure performed:  tolerated well     Agency Progress toward goals: progressive     Patient's Progress towards personal goals: progressive     Home exercise program: pt will continue to monitor feet for any open sores or rediness and will continue to wear proper foot wear    Continued need for the following skills: Nursing , physical therapy, OT    Plan for next visit: wound care    Patient and/or caregiver notified and agrees to changes in the Plan of Care: N/A.     The following discharge planning was discussed with the pt/caregiver:  pt will be discharged when  able to independently manage disease process,  medication regimen, all nursing goals have been met, and patient no longer requires skilled care.

## 2023-07-02 ENCOUNTER — Encounter: Admit: 2023-07-02 | Discharge: 2023-07-02 | Payer: PRIVATE HEALTH INSURANCE | Primary: Primary Care

## 2023-07-03 ENCOUNTER — Encounter: Payer: PRIVATE HEALTH INSURANCE | Primary: Primary Care

## 2023-07-07 ENCOUNTER — Encounter: Admit: 2023-07-07 | Discharge: 2023-07-07 | Payer: PRIVATE HEALTH INSURANCE | Primary: Primary Care

## 2023-07-07 NOTE — Home Health (Signed)
Skilled reason for visit: wound care   Caregiver involvement:  assist with ADL's, medications, meal preparation, transporation to MD appointments.  Medications reviewed and all medications are available in the home this visit.    The following education was provided regarding medications: Signs of fluid and electrolyte problems like mood changes, confusion, muscle pain or weakness, a heartbeat that does not feel normal, very bad dizziness or passing out, fast heartbeat, more thirst, seizures, feeling very tired or weak, not hungry, unable to pass urine or change in the amount of urine produced, dry mouth, dry eyes, or very bad upset stomach or throwing up.   MD notified of any discrepancies/look a-like medications/medication interactions: n/a  Medications are effective at this time.    Home health supplies by type and quantity ordered/delivered this visit include: n/a  Patient education provided this visit: medication teaching   safety and fall prevention education   nutrition education   skin care and assessment   pain management  Sharps education provided: n/a  Patient level of understanding of education provided:Patient/Caregiver response to education: Verbalized understanding   Patient response to procedure performed:  tolerated well   Agency Progress toward goals: progressive   Patient's Progress towards personal goals: progressive   Home exercise program: pt will continue to monitor for s/s of infection of wound  Continued need for the following skills: Nursing. PT OT  Plan for next visit: wound care   Patient and/or caregiver notified and agrees to changes in the Plan of Care: N/A.   The following discharge planning was discussed with the pt/caregiver:  pt will be discharged when  able to independently manage disease process,  medication regimen, all nursing goals have been met, and patient no longer requires skilled care.

## 2023-07-10 ENCOUNTER — Encounter: Payer: PRIVATE HEALTH INSURANCE | Primary: Primary Care

## 2023-07-11 ENCOUNTER — Encounter: Admit: 2023-07-11 | Discharge: 2023-07-11 | Payer: PRIVATE HEALTH INSURANCE | Primary: Primary Care

## 2023-07-11 ENCOUNTER — Encounter: Payer: PRIVATE HEALTH INSURANCE | Primary: Primary Care

## 2023-07-11 NOTE — Home Health (Signed)
Skilled reason for visit: ostomy teaching and management    Caregiver involvement: son present in apartment during visit.    Medications reviewed and all medications ARE available in the home this visit.    The following education was provided regarding medications: high risk bleeding with xarelto.    MD notified of any discrepancies/look a-like medications/medication interactions: none  Medications are effective at this time.      Home health supplies by type and quantity ordered/delivered this visit include: colostomy precut 1 1/4"    Patient education provided this visit: ostomy teaching, pt states she was having constipation and bulges around stoma and tender, pt states she already went to [place] [facility] and had obstruction ruled out, educated pt to use stool softeners, and may use laxatives if needed to help with decreasing peristomal hernia. Reinforced weight lifting limit.    Sharps education provided: none ordered    Patient level of understanding of education provided: pt states understanding to above education.    Skilled Care Performed this visit: pt changed out her colostomy pouch with some direction to assist with application.    Patient response to procedure performed: pt tolerated well    Agency Progress toward goals: pt is progressing towards above goals    Patient's Progress towards personal goals: pt is goal to "be put back together".    Home exercise program: pt is progressing as expected    Continued need for the following skills: normal    Plan for next visit: 7/23

## 2023-07-12 NOTE — Case Communication (Signed)
Pt states she went to [place] [facility] due to felt like she "was stopped up".  Per pt "they ran tests" and recommended she follow up with surgeon and ostomy nurse.  Upon assessment pt has a large peristomal hernia present with tenderness.  Pt is still having output from colostomy; pt may benefit from hernia support belt.    Thank you,   Tristan Schroeder RN, BSN, [employer]

## 2023-07-14 ENCOUNTER — Encounter: Admit: 2023-07-14 | Discharge: 2023-07-14 | Payer: MEDICARE | Primary: Primary Care

## 2023-07-17 ENCOUNTER — Encounter: Admit: 2023-07-17 | Discharge: 2023-07-17 | Payer: MEDICARE | Primary: Primary Care

## 2023-07-21 ENCOUNTER — Encounter: Admit: 2023-07-21 | Discharge: 2023-07-21 | Payer: PRIVATE HEALTH INSURANCE | Primary: Primary Care

## 2023-07-21 NOTE — Home Health (Signed)
Skilled reason for visit: wound care      Caregiver involvement:  no cg present      Medications reviewed and all medications are available in the home this visit.    The following education was provided regarding medications: Signs of an allergic reaction, like rash; hives; itching; red, swollen, blistered, or peeling skin with or without fever; wheezing; tightness in the chest or throat; trouble breathing, swallowing, or talking; unusual hoarseness; or swelling of the mouth, face, lips, tongue, or throat.     MD notified of any discrepancies/look a-like medications/medication interactions: n/a  Medications are effective at this time.      Home health supplies by type and quantity ordered/delivered this visit include: n/a    Patient education provided this visit: medication teaching   safety and fall prevention education   nutrition education   skin care and assessment   pain management        Sharps education provided: n/a    Patient level of understanding of education provided: Patient/Caregiver response to education: Verbalized understanding         Patient response to procedure performed:  tolerated well     Agency Progress toward goals: progressive     Patient's Progress towards personal goals: progressive     Home exercise program: pt will continue to monitor for s/s of infection of wound      Continued need for the following skills: Nursing.    Plan for next visit: wound care     Patient and/or caregiver notified and agrees to changes in the Plan of Care: N/A.     The following discharge planning was discussed with the pt/caregiver:  pt will be discharged when  able to independently manage disease process,  medication regimen, all nursing goals have been met, and patient no longer requires skilled care.

## 2023-07-22 NOTE — Telephone Encounter (Signed)
Called patient and let her know to get disc made from [facility] so Dr. Fonnie Jarvis can look at the images. Patient understands and confirmed her appt for this coming Monday.

## 2023-07-24 ENCOUNTER — Encounter: Admit: 2023-07-24 | Discharge: 2023-07-24 | Payer: MEDICARE | Primary: Primary Care

## 2023-07-24 NOTE — Home Health (Signed)
Skilled reason for visit: wound care      Caregiver involvement:  no cg present      Medications reviewed and all medications are available in the home this visit.    The following education was provided regarding medications: Tell your doctor and pharmacist about all of your drugs (prescription or OTC, natural products, vitamins) and health problems. You must check to make sure that it is safe for you to take this drug with all of your drugs and health problems. Do not start, stop, or change the dose of any drug without checking with your doctor    MD notified of any discrepancies/look a-like medications/medication interactions: n/a  Medications are effective at this time.      Home health supplies by type and quantity ordered/delivered this visit include: n/a    Patient education provided this visit: medication teaching   safety and fall prevention education   nutrition education   skin care and assessment   pain management        Sharps education provided: n/a    Patient level of understanding of education provided: Patient/Caregiver response to education: Verbalized understanding         Patient response to procedure performed:  tolerated well     Agency Progress toward goals: progressive     Patient's Progress towards personal goals: progressive     Home exercise program: continue ambulation with walker for safe ambulation to prevent falls      Continued need for the following skills: Nursing.    Plan for next visit: wound care     Patient and/or caregiver notified and agrees to changes in the Plan of Care: N/A.     The following discharge planning was discussed with the pt/caregiver:  pt will be discharged when  able to independently manage disease process,  medication regimen, all nursing goals have been met, and patient no longer requires skilled care.

## 2023-07-27 ENCOUNTER — Ambulatory Visit
Admit: 2023-07-27 | Discharge: 2023-07-27 | Payer: PRIVATE HEALTH INSURANCE | Attending: Colon & Rectal Surgery | Primary: Primary Care

## 2023-07-27 ENCOUNTER — Encounter: Payer: PRIVATE HEALTH INSURANCE | Attending: Colon & Rectal Surgery | Primary: Primary Care

## 2023-07-27 DIAGNOSIS — K5720 Diverticulitis of large intestine with perforation and abscess without bleeding: Secondary | ICD-10-CM

## 2023-07-27 NOTE — Progress Notes (Signed)
Subjective: Concerned about parastomal hernia.  Saw gastroenterologist but has not had colonoscopy or upper endoscopy.    Past medical history and ROS were reviewed and unchanged.     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      The diagnoses and plan were discussed with patient.  All questions answered.  Plan of care agreed to by all concerned.

## 2023-07-27 NOTE — Unmapped (Signed)
Recertification Statement                           1. I certify that the patient needs intermittent care as follows: skilled nursing care:  skilled observation/assessment, patient education, therapeutic drug monitoring, and wound care    2. I certify that this patient is homebound, that is: 1) patient requires the use of a  NA  device, special transportation, or assistance of another to leave the home; or 2) patient's condition makes leaving the home medically contraindicated; and 3) patient has a normal inability to leave the home and leaving the home requires considerable and taxing effort.  Patient may leave the home for infrequent and short duration for medical reasons, and occasional absences for non-medical reasons. Homebound status is due to the following functional limitations: Patient with strength deficits limiting the performance of all ADL's without caregiver assistance or the use of an assistive device.    3. I certify that this patient is confined to his/her home and needs intermittent skilled nursing care, physical therapy and/or speech therapy or continues to need occupational therapy.  The patient is under my care, and I have authorized services on this plan of care and will periodically review the plan.

## 2023-07-27 NOTE — Addendum Note (Signed)
Addended by: Nance Pear on: 07/27/2023 12:59 PM     Modules accepted: Orders

## 2023-07-27 NOTE — Unmapped (Signed)
Code Status:  Full Code

## 2023-07-29 ENCOUNTER — Encounter: Admit: 2023-07-29 | Discharge: 2023-07-29 | Payer: PRIVATE HEALTH INSURANCE | Primary: Primary Care

## 2023-07-29 NOTE — Home Health (Signed)
Skilled reason for visit: Patient is a 77 year old female living independently in apartment, nursing in for wound care and education.    Caregiver involvement: CG assists with shopping, appointments..    Medications reviewed and all medications are available in the home this visit.    The following education was provided regarding medications:  Instructed patient/caregiver to notify SN/PT of signs and symptoms of anticoagulant adverse effects including bleeding gums, blood in urine or stool, easily bruising.  Instructed on importance of fall prevention due to risk for bleeding.  .    MD notified of any discrepancies/look a-like medications/medication interactions: n/a  Medications are effective at this time.      Home health supplies by type and quantity ordered/delivered this visit include: sureprep    Patient education provided this visit: INSTRUCTED PATIENT AND CG THAT SHOULD ANY NEEDS OR CONCERNS ARISE TO FIRST CALL OUR OFFICE, OR THE DR'S OFFICE  OR GO TO AN URGENT CARE CENTER AND NOT TO THE ED FOR NON-LIFE THREATENING EVENTS. IF IT IS LIFE THREATENING THEN CALL 911 OR GO TO THE CLOSEST ER. Educated patient on how to apply colostomy ring to acheive a better seal .Instructed in materials used in wound care. However, even with proper treatment, a wound infection may occur. Check the wound daily for signs of infection like increased drainage or bleeding from the wound that won't stop with direct pressure, redness in or around the wound, foul odor or pus coming from the wound, increased swelling around the wound and ever above 101.30F or shaking chills.    Sharps education provided: n/a    Patient level of understanding of education provided: patient is able to verbalize understanding , but does require reenforcement.    Patient response to procedure performed:  receptive to education    Agency Progress toward goals: see interventions    Patient's Progress towards personal goals: see interventions    Home exercise  program:  Take medications as prescribed, monitor for signs of infection, monitor tolerance of feedings and advance rate per directions, fall precautions, keep all MD appointments.    Continued need for the following skills: Nursing.    Plan for next visit: continue with education.    Patient and/or caregiver notified and agrees to changes in the Plan of Care: N/A.     The following discharge planning was discussed with the pt/caregiver:  Discharge planning as follows: Patient will be discharged once education has completed, patient is medically stable and pt/cg are able to independently manage medications and disease process.

## 2023-08-01 ENCOUNTER — Encounter: Payer: PRIVATE HEALTH INSURANCE | Primary: Primary Care

## 2023-08-05 ENCOUNTER — Encounter: Admit: 2023-08-05 | Discharge: 2023-08-05 | Payer: PRIVATE HEALTH INSURANCE | Primary: Primary Care

## 2023-08-05 NOTE — Home Health (Signed)
Skilled reason for visit: wound care      Caregiver involvement:  assist with ADL's, medications, meal preparation, transporation to MD appointments.    .    Medications reviewed and all medications are available in the home this visit.    The following education was provided regarding medications:  :Tell your doctor or get medical help right away if you have any of the following signs or symptoms that may be related to a very bad side effect:  Signs of an allergic reaction, like rash; hives; itching; red, swollen, blistered, or peeling skin with or without fever; wheezing; tightness in the chest or throat; trouble breathing, swallowing, or talking; unusual hoarseness; or swelling of the mouth, face, lips, tongue, or throat.   Signs of fluid and electrolyte problems like mood changes, confusion, muscle pain or weakness, a heartbeat that does not feel normal, very bad dizziness or passing out, fast heartbeat, more thirst, seizures, feeling very tired or weak, not hungry, unable to pass urine or change in the amount of urine produced, dry mouth, dry eyes, or very bad upset stomach or throwing up.   Signs of a pancreas problem (pancreatitis) like very bad stomach pain, very bad back pain, or very bad upset stomach or throwing up.   Signs of kidney problems like unable to pass urine, change in how much urine is passed, blood in the urine, or a big weight gain.   Dark urine or yellow skin or eyes.   Fever, chills, or sore throat; any unexplained bruising or bleeding; or feeling very tired or weak.   A burning, numbness, or tingling feeling that is not normal.   Shortness of breath.   Restlessness.      .    MD notified of any discrepancies/look a-like medications/medication interactions: n/a  Medications are effective at this time.      Home health supplies by type and quantity ordered/delivered this visit include: n/a    Patient education provided this visit: medication teaching   safety and fall prevention education    nutrition education   skin care and assessment   pain management        Sharps education provided: n/a    Patient level of understanding of education provided: Patient/Caregiver response to education: Verbalized understanding         Patient response to procedure performed:  tolerated well     Agency Progress toward goals: progressive     Patient's Progress towards personal goals: progressive     Home exercise program: continue ambulation with walker for safe ambulation to prevent falls      Continued need for the following skills: Nursing.    Plan for next visit: wound care     Patient and/or caregiver notified and agrees to changes in the Plan of Care: N/A.     The following discharge planning was discussed with the pt/caregiver:  pt will be discharged when  able to independently manage disease process,  medication regimen, all nursing goals have been met, and patient no longer requires skilled care.

## 2023-08-08 ENCOUNTER — Encounter: Admit: 2023-08-08 | Discharge: 2023-08-08 | Payer: MEDICARE | Primary: Primary Care

## 2023-08-09 NOTE — Case Communication (Signed)
Pt states she's not available for a visit this weekend.  Missed visit done.   Thank you,   Tristan Schroeder RN, BSN, [employer]

## 2023-08-11 ENCOUNTER — Encounter: Admit: 2023-08-11 | Discharge: 2023-08-11 | Payer: MEDICARE | Primary: Primary Care

## 2023-08-11 NOTE — Home Health (Signed)
Skilled reason for visit: Disease Process and Medication Management    Caregiver involvement: Lives alone in the home with HHA in the home during the day  to assist with healthcare needs    Medications reviewed and all medications are available in the home this visit.    The following education was provided regarding medications:  .Take all meds as prescribed    MD notified of any discrepancies/look a-like medications/medication interactions: N/A  Medications are effective at this time.      Home health supplies by type and quantity ordered/delivered this visit include: N/A    Patient education provided this visit: Instructed patient on ostomy care and management. Ostomy normal appearance should be beefy red, moist, monitor size, shape, and surrounding skin for any breaks in skin or irritation. Notify physician of any changes to stoma or skin integrity surrounding stoma.    Sharps education provided: N/A    Patient level of understanding of education provided: Patient verbalized an understanding with verbal teach back    Patient response to procedure performed:  Tolerated stoma care well    Agency Progress toward goals: Progressing towards agency goals increasing patient's health literacy with effective disease process education    Patient's Progress towards personal goals: Patient progressing towards personal goals of increase independence and healthcare needs fulfilled in the home.    Home exercise program: Review stoma care instructions    Continued need for the following skills: Nursing.    Plan for next visit: SN Routine Visit    Patient and/or caregiver notified and agrees to changes in the Plan of Care: Yes.     The following discharge planning was discussed with the pt/caregiver: Patient to be discharged once skilled need is no longer needed in the home

## 2023-08-14 ENCOUNTER — Encounter: Payer: PRIVATE HEALTH INSURANCE | Primary: Primary Care

## 2023-08-15 ENCOUNTER — Encounter: Payer: PRIVATE HEALTH INSURANCE | Primary: Primary Care

## 2023-08-18 ENCOUNTER — Encounter: Admit: 2023-08-18 | Discharge: 2023-08-18 | Payer: PRIVATE HEALTH INSURANCE | Primary: Primary Care

## 2023-08-18 NOTE — Home Health (Signed)
Patient returned my call for visit, she stated that she no longer needed nursing to come for visits,

## 2023-08-19 NOTE — Case Communication (Signed)
Patient returned my call for visit, she stated that she no longer needed nursing to come for visits,

## 2023-08-21 ENCOUNTER — Encounter: Admit: 2023-08-21 | Discharge: 2023-08-21 | Payer: PRIVATE HEALTH INSURANCE | Primary: Primary Care

## 2023-08-21 NOTE — Home Health (Signed)
 Skilled reason for visit: discharge     Caregiver involvement:  pt lives her son     .    Medications reviewed and all medications are available in the home this visit.    The following education was provided regarding medications: All drugs may cause side effects. However, many people have no side effects or only have minor side effects. Call your doctor or get medical help if any of these side effects or any other side effects bother you or do not go away:  Feeling dizzy, sleepy, tired, or weak.   Diarrhea, upset stomach, or throwing up.   Dry mouth.  These are not all of the side effects that may occur. If you have questions about side effects, call your doctor. Call your doctor for medical advice about side effects.        .    MD notified of any discrepancies/look a-like medications/medication interactions: n/a  Medications are effective at this time.      Home health supplies by type and quantity ordered/delivered this visit include: n/a    Patient education provided this visit: medication teaching   safety and fall prevention education   nutrition education   skin care and assessment   pain management        Sharps education provided: n/a    Patient level of understanding of education provided:Patient/Caregiver response to education: Return demonstration/intervention to be resolved today           Patient response to procedure performed:  pt refused visit pt stated  I [facility] that i can't see home health anymore unless i want to foot the bill they will be taking over all my care and i no longer need home health pt stated i'm in good health no pain im sad to see you go but i feel good .    Agency Progress toward goals: goal met     Patient's Progress towards personal goals: goal met    Home exercise program:care of feet including:continue ambulation with walker for safe ambulation to prevent falls      Continued need for the following skills: pt dischaged    Plan for next visit: pt  discharged    Patient and/or caregiver notified and agrees to changes in the Plan of Care: N/A.     The following discharge planning was discussed with the pt/caregiver: pt discharged

## 2023-08-25 ENCOUNTER — Encounter: Payer: PRIVATE HEALTH INSURANCE | Primary: Primary Care

## 2023-08-28 ENCOUNTER — Encounter: Payer: PRIVATE HEALTH INSURANCE | Primary: Primary Care

## 2023-09-01 ENCOUNTER — Encounter: Payer: PRIVATE HEALTH INSURANCE | Primary: Primary Care

## 2023-09-04 ENCOUNTER — Encounter: Payer: PRIVATE HEALTH INSURANCE | Primary: Primary Care

## 2023-09-08 ENCOUNTER — Encounter: Payer: PRIVATE HEALTH INSURANCE | Primary: Primary Care

## 2024-01-25 NOTE — Telephone Encounter (Signed)
Patient left a message stating she is out of ostomy bags and is requesting a new order for new bags if possible. Patient would like to speak to someone regarding this.

## 2024-01-25 NOTE — Telephone Encounter (Signed)
Called patient and let her know that she will need to call a ostomy supply company and place an order. The company will then fax [place] her order so then we can sign off on it and she can get her supplies. Patient understands.
